# Patient Record
Sex: Male | Born: 1980 | Race: Black or African American | Hispanic: No | Marital: Single | Smoking: Current every day smoker
Health system: Southern US, Community
[De-identification: ages and names within clinical notes are randomized; demographics above are authoritative.]

## PROBLEM LIST (undated history)

## (undated) DIAGNOSIS — E119 Type 2 diabetes mellitus without complications: Secondary | ICD-10-CM

## (undated) DIAGNOSIS — F17200 Nicotine dependence, unspecified, uncomplicated: Secondary | ICD-10-CM

---

## 2014-12-09 ENCOUNTER — Emergency Department (HOSPITAL_BASED_OUTPATIENT_CLINIC_OR_DEPARTMENT_OTHER): Payer: Self-pay

## 2014-12-09 ENCOUNTER — Encounter (HOSPITAL_BASED_OUTPATIENT_CLINIC_OR_DEPARTMENT_OTHER): Payer: Self-pay

## 2014-12-09 ENCOUNTER — Emergency Department (HOSPITAL_BASED_OUTPATIENT_CLINIC_OR_DEPARTMENT_OTHER)
Admission: EM | Admit: 2014-12-09 | Discharge: 2014-12-10 | Disposition: A | Discharge status: Home / Self Care | Payer: Self-pay | Attending: Emergency Medicine | Admitting: Emergency Medicine

## 2014-12-09 DIAGNOSIS — K5900 Constipation, unspecified: Secondary | ICD-10-CM | POA: Insufficient documentation

## 2014-12-09 DIAGNOSIS — Z720 Tobacco use: Secondary | ICD-10-CM | POA: Insufficient documentation

## 2014-12-09 DIAGNOSIS — R110 Nausea: Secondary | ICD-10-CM | POA: Insufficient documentation

## 2014-12-09 LAB — CBC WITH DIFFERENTIAL/PLATELET
Basophils Absolute: 0.1 10*3/uL (ref 0.0–0.1)
Basophils Relative: 1 %
EOS ABS: 0.1 10*3/uL (ref 0.0–0.7)
Eosinophils Relative: 2 %
HEMATOCRIT: 44.5 % (ref 39.0–52.0)
HEMOGLOBIN: 16.2 g/dL (ref 13.0–17.0)
LYMPHS ABS: 3.6 10*3/uL (ref 0.7–4.0)
LYMPHS PCT: 49 %
MCH: 31.6 pg (ref 26.0–34.0)
MCHC: 36.4 g/dL — AB (ref 30.0–36.0)
MCV: 86.9 fL (ref 78.0–100.0)
MONOS PCT: 9 %
Monocytes Absolute: 0.7 10*3/uL (ref 0.1–1.0)
NEUTROS PCT: 39 %
Neutro Abs: 2.8 10*3/uL (ref 1.7–7.7)
Platelets: 321 10*3/uL (ref 150–400)
RBC: 5.12 MIL/uL (ref 4.22–5.81)
RDW: 12 % (ref 11.5–15.5)
WBC: 7.2 10*3/uL (ref 4.0–10.5)

## 2014-12-09 LAB — COMPREHENSIVE METABOLIC PANEL
ALBUMIN: 4.3 g/dL (ref 3.5–5.0)
ALK PHOS: 64 U/L (ref 38–126)
ALT: 37 U/L (ref 17–63)
AST: 25 U/L (ref 15–41)
Anion gap: 5 (ref 5–15)
BUN: 14 mg/dL (ref 6–20)
CHLORIDE: 103 mmol/L (ref 101–111)
CO2: 31 mmol/L (ref 22–32)
CREATININE: 1.1 mg/dL (ref 0.61–1.24)
Calcium: 9.2 mg/dL (ref 8.9–10.3)
GFR calc non Af Amer: >60 mL/min (ref >60)
GLUCOSE: 131 mg/dL — AB (ref 65–99)
Potassium: 3.6 mmol/L (ref 3.5–5.1)
Sodium: 139 mmol/L (ref 135–145)
Total Bilirubin: 0.7 mg/dL (ref 0.3–1.2)
Total Protein: 7.3 g/dL (ref 6.5–8.1)

## 2014-12-09 LAB — LIPASE, BLOOD: Lipase: 33 U/L (ref 11–51)

## 2014-12-09 NOTE — ED Provider Notes (Signed)
CSN: [id number]     Arrival date & time 12/09/14  2156 History  By signing my name below, I, Tanda Rockers, attest that this documentation has been prepared under the direction and in the presence of Shon Baton, MD. Electronically Signed: Tanda Rockers, ED Scribe. 12/09/2014. 11:15 PM.  Chief Complaint  Patient presents with  . Abdominal Pain   The history is provided by the patient. No language interpreter was used.     HPI Comments: Frank Duncan is a 34 y.o. male who presents to the Emergency Department complaining of gradual onset, intermittent, tight, 6/10, lower abdominal pain x 1 week. Pt also complains of constipation, nausea, and vomiting. He states his last bowel movement was 3 days ago which he describes as soft. He has been taking laxatives with some relief from the constipation and abdominal pain, but he states the abdominal pain returns. Denies fever, chills, any other associated symptoms.    History reviewed. No pertinent past medical history. History reviewed. No pertinent past surgical history. No family history on file. Social History  Substance Use Topics  . Smoking status: Current Every Day Smoker  . Smokeless tobacco: None  . Alcohol Use: Yes     Comment: weekends    Review of Systems  Constitutional: Negative for fever and chills.  Respiratory: Negative for shortness of breath.   Cardiovascular: Negative for chest pain.  Gastrointestinal: Positive for nausea, vomiting, abdominal pain and constipation. Negative for diarrhea.  All other systems reviewed and are negative.  Allergies  Review of patient's allergies indicates no known allergies.  Home Medications   Prior to Admission medications   Medication Sig Start Date End Date Taking? Authorizing Provider  docusate sodium (COLACE) 100 MG capsule Take 1 capsule (100 mg total) by mouth every 12 (twelve) hours. 12/10/14   Shon Baton, MD  polyethylene glycol powder (MIRALAX) powder Take one  capful by mouth 2 times a day until stools are loose 12/10/14   Shon Baton, MD   BP 152/99 mmHg  Pulse 74  Temp(Src) 98.5 F (36.9 C) (Oral)  Resp 18  Ht  (1.753 m)  Wt 255 lb (115.667 kg)  BMI 37.64 kg/m2  SpO2 98%   Physical Exam  Constitutional: He is oriented to person, place, and time. He appears well-developed and well-nourished. No distress.  HENT:  Head: Normocephalic and atraumatic.  Cardiovascular: Normal rate, regular rhythm and normal heart sounds.   No murmur heard. Pulmonary/Chest: Effort normal and breath sounds normal. No respiratory distress. He has no wheezes.  Abdominal: Soft. Bowel sounds are normal. He exhibits no distension. There is no tenderness. There is no rebound.  Musculoskeletal: He exhibits no edema.  Neurological: He is alert and oriented to person, place, and time.  Skin: Skin is warm and dry.  Psychiatric: He has a normal mood and affect.  Nursing note and vitals reviewed.   ED Course  Procedures (including critical care time)  DIAGNOSTIC STUDIES: Oxygen Saturation is 98% on RA, normal by my interpretation.    COORDINATION OF CARE: 11:15 PM-Discussed treatment plan which includes DG Abdominal series, CBC, CMP, Lipase with pt at bedside and pt agreed to plan.   Labs Review Labs Reviewed  CBC WITH DIFFERENTIAL/PLATELET - Abnormal; Notable for the following:    MCHC 36.4 (*)    All other components within normal limits  COMPREHENSIVE METABOLIC PANEL - Abnormal; Notable for the following:    Glucose, Bld 131 (*)    All other components  within normal limits  URINALYSIS, ROUTINE W REFLEX MICROSCOPIC (NOT AT [facility]) - Abnormal; Notable for the following:    Ketones, ur 15 (*)    All other components within normal limits  LIPASE, BLOOD    Imaging Review Dg Abd 1 View  12/09/2014  CLINICAL DATA:  Abdominal pain for 1 week EXAM: ABDOMEN - 1 VIEW COMPARISON:  None. FINDINGS: Scattered large and small bowel gas is seen. No obstructive  changes are noted. Mild fecal material is noted within the right colon. No acute bony abnormality is seen. IMPRESSION: No acute abnormality noted. Electronically Signed   By: Alcide CleverMark  Lukens M.D.   On: 12/09/2014 23:30   I have personally reviewed and evaluated these images and lab results as part of my medical decision-making.   EKG Interpretation None      MDM   Final diagnoses:  Constipation, unspecified constipation type   Patient presents with abdominal pain and constipation. Reports one-week history of abdominal bloating and fullness over the lower abdomen. Nontoxic on exam. Vital signs reassuring. Abdominal exam is unremarkable. Basic lab work and plain films of abdomen are reassuring. Patient does have a mild amount of fecal matter in the right colon. Clinically, he reports constipation. Given benign abdominal exam, will discharge with MiraLAX and Colace.  Patient was given return cautions.  After history, exam, and medical workup I feel the patient has been appropriately medically screened and is safe for discharge home. Pertinent diagnoses were discussed with the patient. Patient was given return precautions.  I personally performed the services described in this documentation, which was scribed in my presence. The recorded information has been reviewed and is accurate.      Shon Batonourtney F Zebedee Segundo, MD 12/10/14 [phone]

## 2014-12-09 NOTE — ED Notes (Addendum)
Abd pain, constipation x 1 week-pt states he took a laxative last Tuesday with small results-also took "2 colon cleanse" yesterday-small BM today-denies vomiting

## 2014-12-10 LAB — URINALYSIS, ROUTINE W REFLEX MICROSCOPIC
Bilirubin Urine: NEGATIVE
GLUCOSE, UA: NEGATIVE mg/dL
Hgb urine dipstick: NEGATIVE
Ketones, ur: 15 mg/dL — AB
LEUKOCYTES UA: NEGATIVE
NITRITE: NEGATIVE
PH: 7 (ref 5.0–8.0)
Protein, ur: NEGATIVE mg/dL
SPECIFIC GRAVITY, URINE: 1.028 (ref 1.005–1.030)
Urobilinogen, UA: 1 mg/dL (ref 0.0–1.0)

## 2014-12-10 MED ORDER — DOCUSATE SODIUM 100 MG PO CAPS: 100.0000 mg | ORAL_CAPSULE | Freq: Two times a day (BID) | ORAL | Status: AC

## 2014-12-10 MED ORDER — POLYETHYLENE GLYCOL 3350 17 GM/SCOOP PO POWD: ORAL | Status: AC

## 2014-12-10 NOTE — Discharge Instructions (Signed)
Constipation, Adult Constipation is when a person has fewer than three bowel movements a week, has difficulty having a bowel movement, or has stools that are dry, hard, or larger than normal. As people grow older, constipation is more common. A low-fiber diet, not taking in enough fluids, and taking certain medicines may make constipation worse.  CAUSES   Certain medicines, such as antidepressants, pain medicine, iron supplements, antacids, and water pills.   Certain diseases, such as diabetes, irritable bowel syndrome (IBS), thyroid disease, or depression.   Not drinking enough water.   Not eating enough fiber-rich foods.   Stress or travel.   Lack of physical activity or exercise.   Ignoring the urge to have a bowel movement.   Using laxatives too much.  SIGNS AND SYMPTOMS   Having fewer than three bowel movements a week.   Straining to have a bowel movement.   Having stools that are hard, dry, or larger than normal.   Feeling full or bloated.   Pain in the lower abdomen.   Not feeling relief after having a bowel movement.  DIAGNOSIS  Your health care provider will take a medical history and perform a physical exam. Further testing may be done for severe constipation. Some tests may include:  A barium enema X-ray to examine your rectum, colon, and, sometimes, your small intestine.   A sigmoidoscopy to examine your lower colon.   A colonoscopy to examine your entire colon. TREATMENT  Treatment will depend on the severity of your constipation and what is causing it. Some dietary treatments include drinking more fluids and eating more fiber-rich foods. Lifestyle treatments may include regular exercise. If these diet and lifestyle recommendations do not help, your health care provider may recommend taking over-the-counter laxative medicines to help you have bowel movements. Prescription medicines may be prescribed if over-the-counter medicines do not work.    HOME CARE INSTRUCTIONS   Eat foods that have a lot of fiber, such as fruits, vegetables, whole grains, and beans.  Limit foods high in fat and processed sugars, such as french fries, hamburgers, cookies, candies, and soda.   A fiber supplement may be added to your diet if you cannot get enough fiber from foods.   Drink enough fluids to keep your urine clear or pale yellow.   Exercise regularly or as directed by your health care provider.   Go to the restroom when you have the urge to go. Do not hold it.   Only take over-the-counter or prescription medicines as directed by your health care provider. Do not take other medicines for constipation without talking to your health care provider first.  SEEK IMMEDIATE MEDICAL CARE IF:   You have bright red blood in your stool.   Your constipation lasts for more than 4 days or gets worse.   You have abdominal or rectal pain.   You have thin, pencil-like stools.   You have unexplained weight loss.  Vomiting MAKE SURE YOU:   Understand these instructions.  Will watch your condition.  Will get help right away if you are not doing well or get worse.   This information is not intended to replace advice given to you by your health care provider. Make sure you discuss any questions you have with your health care provider.   Document Released: 10/24/2003 Document Revised: 02/15/2014 Document Reviewed: 11/06/2012 Elsevier Interactive Patient Education [employer]2016 Elsevier Inc.

## 2015-11-13 ENCOUNTER — Encounter (HOSPITAL_BASED_OUTPATIENT_CLINIC_OR_DEPARTMENT_OTHER): Payer: Self-pay | Admitting: *Deleted

## 2015-11-13 ENCOUNTER — Inpatient Hospital Stay (HOSPITAL_BASED_OUTPATIENT_CLINIC_OR_DEPARTMENT_OTHER)
Admission: EM | Admit: 2015-11-13 | Discharge: 2015-11-18 | DRG: 639 | Disposition: A | Discharge status: Home / Self Care | Payer: Self-pay | Attending: Family Medicine | Admitting: Family Medicine

## 2015-11-13 DIAGNOSIS — Z79899 Other long term (current) drug therapy: Secondary | ICD-10-CM

## 2015-11-13 DIAGNOSIS — F17210 Nicotine dependence, cigarettes, uncomplicated: Secondary | ICD-10-CM | POA: Diagnosis present

## 2015-11-13 DIAGNOSIS — E1100 Type 2 diabetes mellitus with hyperosmolarity without nonketotic hyperglycemic-hyperosmolar coma (NKHHC): Principal | ICD-10-CM | POA: Diagnosis present

## 2015-11-13 DIAGNOSIS — F1910 Other psychoactive substance abuse, uncomplicated: Secondary | ICD-10-CM | POA: Diagnosis present

## 2015-11-13 DIAGNOSIS — R059 Cough, unspecified: Secondary | ICD-10-CM

## 2015-11-13 DIAGNOSIS — E1101 Type 2 diabetes mellitus with hyperosmolarity with coma: Secondary | ICD-10-CM

## 2015-11-13 DIAGNOSIS — F17200 Nicotine dependence, unspecified, uncomplicated: Secondary | ICD-10-CM | POA: Diagnosis present

## 2015-11-13 DIAGNOSIS — R05 Cough: Secondary | ICD-10-CM | POA: Diagnosis present

## 2015-11-13 HISTORY — DX: Nicotine dependence, unspecified, uncomplicated: F17.200

## 2015-11-13 HISTORY — DX: Type 2 diabetes mellitus without complications: E11.9

## 2015-11-13 LAB — BASIC METABOLIC PANEL
ANION GAP: 10 (ref 5–15)
BUN: 10 mg/dL (ref 6–20)
CALCIUM: 9.1 mg/dL (ref 8.9–10.3)
CO2: 27 mmol/L (ref 22–32)
CREATININE: 0.8 mg/dL (ref 0.61–1.24)
Chloride: 98 mmol/L — ABNORMAL LOW (ref 101–111)
GFR calc Af Amer: >60 mL/min (ref >60)
GLUCOSE: 330 mg/dL — AB (ref 65–99)
Potassium: 3.5 mmol/L (ref 3.5–5.1)
Sodium: 135 mmol/L (ref 135–145)

## 2015-11-13 LAB — CBC WITH DIFFERENTIAL/PLATELET
BASOS ABS: 0 10*3/uL (ref 0.0–0.1)
BASOS PCT: 1 %
EOS ABS: 0 10*3/uL (ref 0.0–0.7)
EOS PCT: 1 %
HCT: 45.8 % (ref 39.0–52.0)
Hemoglobin: 16.8 g/dL (ref 13.0–17.0)
Lymphocytes Relative: 31 %
Lymphs Abs: 1.3 10*3/uL (ref 0.7–4.0)
MCH: 30.8 pg (ref 26.0–34.0)
MCHC: 36.7 g/dL — ABNORMAL HIGH (ref 30.0–36.0)
MCV: 84 fL (ref 78.0–100.0)
Monocytes Absolute: 0.4 10*3/uL (ref 0.1–1.0)
Monocytes Relative: 11 %
Neutro Abs: 2.3 10*3/uL (ref 1.7–7.7)
Neutrophils Relative %: 56 %
PLATELETS: 226 10*3/uL (ref 150–400)
RBC: 5.45 MIL/uL (ref 4.22–5.81)
RDW: 12.6 % (ref 11.5–15.5)
WBC: 4.1 10*3/uL (ref 4.0–10.5)

## 2015-11-13 LAB — I-STAT VENOUS BLOOD GAS, ED
ACID-BASE EXCESS: 2 mmol/L (ref 0.0–2.0)
BICARBONATE: 30 mmol/L — AB (ref 20.0–28.0)
O2 Saturation: 33 %
TCO2: 32 mmol/L (ref 0–100)
pCO2, Ven: 57.6 mmHg (ref 44.0–60.0)
pH, Ven: 7.322 (ref 7.250–7.430)
pO2, Ven: 22 mmHg — CL (ref 32.0–45.0)

## 2015-11-13 LAB — URINALYSIS, ROUTINE W REFLEX MICROSCOPIC
Bilirubin Urine: NEGATIVE
Glucose, UA: >1000 mg/dL — AB
HGB URINE DIPSTICK: NEGATIVE
Ketones, ur: 15 mg/dL — AB
Leukocytes, UA: NEGATIVE
NITRITE: NEGATIVE
Protein, ur: NEGATIVE mg/dL
SPECIFIC GRAVITY, URINE: 1.031 — AB (ref 1.005–1.030)
pH: 5 (ref 5.0–8.0)

## 2015-11-13 LAB — CBG MONITORING, ED: Glucose-Capillary: 585 mg/dL (ref 65–99)

## 2015-11-13 LAB — GLUCOSE, CAPILLARY
GLUCOSE-CAPILLARY: 312 mg/dL — AB (ref 65–99)
Glucose-Capillary: 366 mg/dL — ABNORMAL HIGH (ref 65–99)
Glucose-Capillary: 277 mg/dL — ABNORMAL HIGH (ref 65–99)

## 2015-11-13 LAB — COMPREHENSIVE METABOLIC PANEL
ALT: 20 U/L (ref 17–63)
AST: 15 U/L (ref 15–41)
Albumin: 4 g/dL (ref 3.5–5.0)
Alkaline Phosphatase: 94 U/L (ref 38–126)
Anion gap: 15 (ref 5–15)
BUN: 14 mg/dL (ref 6–20)
CHLORIDE: 83 mmol/L — AB (ref 101–111)
CO2: 24 mmol/L (ref 22–32)
CREATININE: 1.09 mg/dL (ref 0.61–1.24)
Calcium: 9.3 mg/dL (ref 8.9–10.3)
GFR calc non Af Amer: >60 mL/min (ref >60)
Glucose, Bld: 999 mg/dL (ref 65–99)
Potassium: 4.4 mmol/L (ref 3.5–5.1)
SODIUM: 122 mmol/L — AB (ref 135–145)
Total Bilirubin: 1.3 mg/dL — ABNORMAL HIGH (ref 0.3–1.2)
Total Protein: 7 g/dL (ref 6.5–8.1)

## 2015-11-13 LAB — URINE MICROSCOPIC-ADD ON
Bacteria, UA: NONE SEEN
RBC / HPF: NONE SEEN RBC/hpf (ref 0–5)
Squamous Epithelial / LPF: NONE SEEN

## 2015-11-13 LAB — LIPASE, BLOOD: Lipase: 38 U/L (ref 11–51)

## 2015-11-13 LAB — MRSA PCR SCREENING: MRSA BY PCR: NEGATIVE

## 2015-11-13 MED ORDER — ACETAMINOPHEN 500 MG PO TABS: 1000.0000 mg | ORAL_TABLET | Freq: Once | ORAL | Status: DC

## 2015-11-13 MED ORDER — IBUPROFEN 800 MG PO TABS: 800.0000 mg | ORAL_TABLET | Freq: Once | ORAL | Status: DC

## 2015-11-13 MED ORDER — SODIUM CHLORIDE 0.9 % IV BOLUS (SEPSIS)
2000.0000 mL | Freq: Once | INTRAVENOUS | Status: AC
  Administered 2015-11-13: 2000 mL via INTRAVENOUS

## 2015-11-13 MED ORDER — INSULIN GLARGINE 100 UNIT/ML ~~LOC~~ SOLN
10.0000 [IU] | Freq: Every day | SUBCUTANEOUS | Status: DC
  Administered 2015-11-14: 10 [IU] via SUBCUTANEOUS
  Filled 2015-11-13 (×2): qty 0.1

## 2015-11-13 MED ORDER — DIAZEPAM 5 MG PO TABS: 5.0000 mg | ORAL_TABLET | Freq: Once | ORAL | Status: DC

## 2015-11-13 MED ORDER — ONDANSETRON HCL 4 MG PO TABS: 4.0000 mg | ORAL_TABLET | Freq: Four times a day (QID) | ORAL | Status: DC | PRN

## 2015-11-13 MED ORDER — INSULIN REGULAR BOLUS VIA INFUSION
0.0000 [IU] | Freq: Three times a day (TID) | INTRAVENOUS | Status: DC
  Filled 2015-11-13: qty 10

## 2015-11-13 MED ORDER — ACETAMINOPHEN 325 MG PO TABS
650.0000 mg | ORAL_TABLET | Freq: Four times a day (QID) | ORAL | Status: DC | PRN
  Administered 2015-11-14 – 2015-11-16 (×2): 650 mg via ORAL
  Filled 2015-11-13 (×2): qty 2

## 2015-11-13 MED ORDER — INSULIN REGULAR HUMAN 100 UNIT/ML IJ SOLN
INTRAMUSCULAR | Status: DC
  Filled 2015-11-13: qty 2.5

## 2015-11-13 MED ORDER — DEXTROSE-NACL 5-0.45 % IV SOLN: INTRAVENOUS | Status: DC

## 2015-11-13 MED ORDER — SODIUM CHLORIDE 0.45 % IV SOLN
INTRAVENOUS | Status: DC
  Administered 2015-11-13: 19:00:00 via INTRAVENOUS

## 2015-11-13 MED ORDER — DEXTROSE 50 % IV SOLN: 25.0000 mL | INTRAVENOUS | Status: DC | PRN

## 2015-11-13 MED ORDER — INFLUENZA VAC SPLIT QUAD 0.5 ML IM SUSY
0.5000 mL | PREFILLED_SYRINGE | INTRAMUSCULAR | Status: DC
  Filled 2015-11-13 (×2): qty 0.5

## 2015-11-13 MED ORDER — SODIUM CHLORIDE 0.45 % IV SOLN
INTRAVENOUS | Status: DC
  Administered 2015-11-14: 03:00:00 via INTRAVENOUS

## 2015-11-13 MED ORDER — GI COCKTAIL ~~LOC~~
30.0000 mL | Freq: Once | ORAL | Status: AC
  Administered 2015-11-13: 30 mL via ORAL
  Filled 2015-11-13: qty 30

## 2015-11-13 MED ORDER — ONDANSETRON HCL 4 MG/2ML IJ SOLN: 4.0000 mg | Freq: Four times a day (QID) | INTRAMUSCULAR | Status: DC | PRN

## 2015-11-13 MED ORDER — OXYCODONE HCL 5 MG PO TABS: 5.0000 mg | ORAL_TABLET | Freq: Once | ORAL | Status: DC

## 2015-11-13 MED ORDER — ACETAMINOPHEN 650 MG RE SUPP: 650.0000 mg | Freq: Four times a day (QID) | RECTAL | Status: DC | PRN

## 2015-11-13 MED ORDER — INSULIN ASPART 100 UNIT/ML ~~LOC~~ SOLN
0.0000 [IU] | Freq: Three times a day (TID) | SUBCUTANEOUS | Status: DC
Start: 2015-11-14 — End: 2015-11-14
  Administered 2015-11-14: 5 [IU] via SUBCUTANEOUS
  Administered 2015-11-14: 11 [IU] via SUBCUTANEOUS

## 2015-11-13 MED ORDER — ENOXAPARIN SODIUM 60 MG/0.6ML ~~LOC~~ SOLN
55.0000 mg | Freq: Every day | SUBCUTANEOUS | Status: DC
  Administered 2015-11-14 (×2): 55 mg via SUBCUTANEOUS
  Filled 2015-11-13 (×2): qty 0.6

## 2015-11-13 MED ORDER — SODIUM CHLORIDE 0.9 % IV SOLN
INTRAVENOUS | Status: DC
  Filled 2015-11-13: qty 2.5

## 2015-11-13 MED ORDER — INSULIN REGULAR HUMAN 100 UNIT/ML IJ SOLN
INTRAMUSCULAR | Status: DC
  Administered 2015-11-13: 19:00:00 via INTRAVENOUS
  Filled 2015-11-13 (×2): qty 2.5

## 2015-11-13 MED ORDER — PNEUMOCOCCAL VAC POLYVALENT 25 MCG/0.5ML IJ INJ: 0.5000 mL | INJECTION | INTRAMUSCULAR | Status: DC

## 2015-11-13 MED ORDER — ENOXAPARIN SODIUM 40 MG/0.4ML ~~LOC~~ SOLN: 40.0000 mg | SUBCUTANEOUS | Status: DC

## 2015-11-13 NOTE — ED Triage Notes (Signed)
Upper abdominal pain and bloating. Epigastric burning. He admits to constipation.

## 2015-11-13 NOTE — ED Notes (Signed)
MD at bedside. 

## 2015-11-13 NOTE — H&P (Signed)
History and Physical    Frank Duncan ZOX:[id number] DOB: [birth year] DOA: 11/13/2015  PCP: No PCP Per Patient Consultants:  None Patient coming from: home - lives with girlfriend; NOK: girlfriend, Virgil Benedict, [phone]  Chief Complaint: abdominal pain  HPI: Frank Duncan is a 35 y.o. male with no known past medical history who reports that he has had epigastric abdominal pain all week with emesis.  Very weak.  Mouth has been dry.  Girlfriend talked him into coming in to hospital, glucose 1000.  He got a GI cocktail with some improvement. Pain was a burning all the way from throat to epigastric region, felt like acid reflux.  Pain would come and go, worse with belching.  Polyuria, polydipsia for about a month.  Felt like things were too thick, tough to swallow but he could do it.  Tingling and burning with urination x few nights.  Legs cramping up a lot.   ED Course:  Per Dr. Adela Lank: 35 yo M With epigastric abdominal pain. Suspect reflux based on history. Will obtain laboratory evaluation. Reassess.  Marked hyperglycemia, not acidotic, likely in HHS.  Given iv fluids, started on insulin gtt.  Admit.    Review of Systems: As per HPI; otherwise 10 point review of systems reviewed and negative.   Ambulatory Status:  Ambulates without assistance  History reviewed. No pertinent past medical history.  History reviewed. No pertinent surgical history.  Social History   Social History  . Marital status: Single    Spouse name: N/A  . Number of children: N/A  . Years of education: N/A   Occupational History  . warehouse    Social History Main Topics  . Smoking status: Current Every Day Smoker    Packs/day: 1.00    Years: 20.00  . Smokeless tobacco: Never Used  . Alcohol use Yes     Comment: drinks only socailly but heavily when he does drink  . Drug use:     Types: Marijuana, Cocaine     Comment: almost daily  . Sexual activity: Not on file   Other Topics Concern  . Not on  file   Social History Narrative  . No narrative on file    No Known Allergies  Family History  Problem Relation Age of Onset  . Hypertension Mother   . Diabetes Maternal Grandmother     Prior to Admission medications   Medication Sig Start Date End Date Taking? Authorizing Provider  docusate sodium (COLACE) 100 MG capsule Take 1 capsule (100 mg total) by mouth every 12 (twelve) hours. Patient not taking: Reported on 11/13/2015 12/10/14   Shon Baton, MD  polyethylene glycol powder [facility]) powder Take one capful by mouth 2 times a day until stools are loose Patient not taking: Reported on 11/13/2015 12/10/14   Shon Baton, MD    Physical Exam: Vitals:   11/13/15 1820 11/13/15 1942 11/13/15 2054 11/13/15 2100  BP: 118/85 122/82  139/76  Pulse: 84 67  65  Resp: 18 19  15   Temp:  97.8 F (36.6 C) 98.7 F (37.1 C)   TempSrc:  Oral Oral   SpO2: 97% 99%  98%  Weight:   110 kg (242 lb 8.1 oz)   Height:   5\' 9"  (1.753 m)      General:  Appears calm and comfortable and is NAD Eyes: EOMI, normal lids, iris ENT:  grossly normal hearing, lips & tongue, mmm Neck:  no LAD, masses or thyromegaly Cardiovascular:  RRR, no  m/r/g. No LE edema.  Respiratory:  CTA bilaterally, no w/r/r. Normal respiratory effort. Abdomen:  soft, ntnd, NABS Skin:  no rash or induration seen on limited exam Musculoskeletal:  grossly normal tone BUE/BLE, good ROM, no bony abnormality Psychiatric:  grossly normal mood and affect, speech fluent and appropriate, AOx3 Neurologic:  CN 2-12 grossly intact, moves all extremities in coordinated fashion, sensation intact  Labs on Admission: I have personally reviewed following labs and imaging studies  CBC:  Recent Labs Lab 11/13/15 1715  WBC 4.1  NEUTROABS 2.3  HGB 16.8  HCT 45.8  MCV 84.0  PLT 226   Basic Metabolic Panel:  Recent Labs Lab 11/13/15 1715 11/13/15 2211  NA 122* 135  K 4.4 3.5  CL 83* 98*  CO2 24 27  GLUCOSE 999* 330*    BUN 14 10  CREATININE 1.09 0.80  CALCIUM 9.3 9.1   GFR: Estimated Creatinine Clearance: 156 mL/min (by C-G formula based on SCr of 0.8 mg/dL). Liver Function Tests:  Recent Labs Lab 11/13/15 1715  AST 15  ALT 20  ALKPHOS 94  BILITOT 1.3*  PROT 7.0  ALBUMIN 4.0    Recent Labs Lab 11/13/15 1715  LIPASE 38   No results for input(s): AMMONIA in the last 168 hours. Coagulation Profile: No results for input(s): INR, PROTIME in the last 168 hours. Cardiac Enzymes: No results for input(s): CKTOTAL, CKMB, CKMBINDEX, TROPONINI in the last 168 hours. BNP (last 3 results) No results for input(s): PROBNP in the last 8760 hours. HbA1C: No results for input(s): HGBA1C in the last 72 hours. CBG:  Recent Labs Lab 11/13/15 1812 11/13/15 1934 11/13/15 2155 11/13/15 2255  GLUCAP >600* 585* 366* 277*   Lipid Profile: No results for input(s): CHOL, HDL, LDLCALC, TRIG, CHOLHDL, LDLDIRECT in the last 72 hours. Thyroid Function Tests: No results for input(s): TSH, T4TOTAL, FREET4, T3FREE, THYROIDAB in the last 72 hours. Anemia Panel: No results for input(s): VITAMINB12, FOLATE, FERRITIN, TIBC, IRON, RETICCTPCT in the last 72 hours. Urine analysis:    Component Value Date/Time   COLORURINE YELLOW 11/13/2015 1714   APPEARANCEUR CLEAR 11/13/2015 1714   LABSPEC 1.031 (H) 11/13/2015 1714   PHURINE 5.0 11/13/2015 1714   GLUCOSEU >1000 (A) 11/13/2015 1714   HGBUR NEGATIVE 11/13/2015 1714   BILIRUBINUR NEGATIVE 11/13/2015 1714   KETONESUR 15 (A) 11/13/2015 1714   PROTEINUR NEGATIVE 11/13/2015 1714   UROBILINOGEN 1.0 12/10/2014 0001   NITRITE NEGATIVE 11/13/2015 1714   LEUKOCYTESUR NEGATIVE 11/13/2015 1714    Creatinine Clearance: Estimated Creatinine Clearance: 156 mL/min (by C-G formula based on SCr of 0.8 mg/dL).  Sepsis Labs: @LABRCNTIP (procalcitonin:4,lacticidven:4) ) Recent Results (from the past 240 hour(s))  MRSA PCR Screening     Status: None   Collection Time:  11/13/15  8:57 PM  Result Value Ref Range Status   MRSA by PCR NEGATIVE NEGATIVE Final    Comment:        The GeneXpert MRSA Assay (FDA approved for NASAL specimens only), is one component of a comprehensive MRSA colonization surveillance program. It is not intended to diagnose MRSA infection nor to guide or monitor treatment for MRSA infections.      Radiological Exams on Admission: No results found.  EKG: not done  Assessment/Plan Principal Problem:   Type 2 diabetes mellitus with hyperosmolar nonketotic hyperglycemia (HCC) Active Problems:   Tobacco use disorder   Polysubstance abuse   New-onset DM with HHS -Patient with prior h/o mild hyperglycemia 1 year ago but does not see  a physician regularly -Suspect that patient's reported alcoholic bender over the weekend was enough to tip him into HHS -Clearly hyperglycemic on presentation with glucose >1000 -No acidosis on presentation -Has been on the Glucostabilizer protocol with insulin drip with improvement, gap is now closed and glucose is slowly normalizing -Will transition now to Lantus 10 units, let the patient eat, and turn off insulin drip in 1 hour -Will cover with qAC and qhs SSI -Hgb A1c is pending -Diabetes education requested -Patient is likely to benefit from at least an additional day in the hospital to ensure no rebound of hyperglycemia and ensure adequate DM education -SW consult to assist patient with obtaining a PCP  Tobacco dependence -Patient smokes 1 PPD -Counseling provided regarding the hazards of smoking and DM -Encourage cessation.  This was discussed with the patient and should be reviewed on an ongoing basis.   -Patch ordered at patient request.  Polysubstance abuse -patient acknowledges binge drinking (including this past weekend, with persistent abdominal pain since) -He is less forthcoming about his drug use - he acknowledges frequent if not daily marijuana use and grudgingly admitted  cocaine and other drug abuse (he said something to the effect that he had not used recently enough that he would expect it to show up in his UDS, which is pending) -He has been counseled regarding cessation and this should be encouraged on an ongoing basis   DVT prophylaxis:  Lovenox  Code Status: Full - confirmed with patient/family Family Communication: Girlfriend present for the conclusion of our visit Disposition Plan:  Home once clinically improved Consults called: Diabetes coordinator, SW   Admission status: Admit - It is my clinical opinion that admission to INPATIENT is reasonable and necessary because this patient will require at least 2 midnights in the hospital to treat this condition based on the medical complexity of the problems presented.  Given the aforementioned information, the predictability of an adverse outcome is felt to be significant.     Jonah Blue MD [facility]  If 7PM-7AM, please contact night-coverage [email or web address] Password [email or web address]  11/13/2015, 11:49 PM

## 2015-11-13 NOTE — ED Provider Notes (Signed)
[facility] Provider Note   CSN: [id number] Arrival date & time: 11/13/15  1709     History   Chief Complaint Chief Complaint  Patient presents with  . Abdominal Pain    HPI Frank Duncan is a 35 y.o. male.  35 yo M with a chief complaint of epigastric abdominal pain. Going on for the past week or so. Unsure what makes it better or worse. Nonexertional. Denies shortness breath or diaphoresis with it. Has some bouts of nausea but denies vomiting. Denies any diarrhea denies fevers or chills. Denies injury. Denies any specific foods that make this worse.   The history is provided by the patient.  Abdominal Pain   This is a new problem. The current episode started less than 1 hour ago. The problem occurs constantly. The problem has not changed since onset.The pain is located in the epigastric region. The pain is at a severity of 5/10. The pain is moderate. Associated symptoms include nausea. Pertinent negatives include fever, diarrhea, vomiting, headaches, arthralgias and myalgias. Nothing aggravates the symptoms. Nothing relieves the symptoms.    History reviewed. No pertinent past medical history.  Patient Active Problem List   Diagnosis Date Noted  . Type 2 diabetes mellitus with hyperosmolar nonketotic hyperglycemia (HCC) 11/13/2015    History reviewed. No pertinent surgical history.     Home Medications    Prior to Admission medications   Medication Sig Start Date End Date Taking? Authorizing Provider  docusate sodium (COLACE) 100 MG capsule Take 1 capsule (100 mg total) by mouth every 12 (twelve) hours. 12/10/14   Shon Baton, MD  polyethylene glycol powder (MIRALAX) powder Take one capful by mouth 2 times a day until stools are loose 12/10/14   Shon Baton, MD    Family History No family history on file.  Social History Social History  Substance Use Topics  . Smoking status: Current Every Day Smoker    Packs/day: 1.00  . Smokeless  tobacco: Never Used  . Alcohol use Yes     Comment: weekends     Allergies   Review of patient's allergies indicates no known allergies.   Review of Systems Review of Systems  Constitutional: Negative for chills and fever.  HENT: Negative for congestion and facial swelling.   Eyes: Negative for discharge and visual disturbance.  Respiratory: Negative for shortness of breath.   Cardiovascular: Negative for chest pain and palpitations.  Gastrointestinal: Positive for abdominal pain and nausea. Negative for diarrhea and vomiting.  Musculoskeletal: Negative for arthralgias and myalgias.  Skin: Negative for color change and rash.  Neurological: Negative for tremors, syncope and headaches.  Psychiatric/Behavioral: Negative for confusion and dysphoric mood.     Physical Exam Updated Vital Signs BP 118/85   Pulse 84   Temp 97.9 F (36.6 C) (Oral)   Resp 18   Ht 5\' 9"  (1.753 m)   Wt 240 lb (108.9 kg)   SpO2 97%   BMI 35.44 kg/m   Physical Exam  Constitutional: He is oriented to person, place, and time. He appears well-developed and well-nourished.  HENT:  Head: Normocephalic and atraumatic.  Eyes: EOM are normal. Pupils are equal, round, and reactive to light.  Neck: Normal range of motion. Neck supple. No JVD present.  Cardiovascular: Normal rate and regular rhythm.  Exam reveals no gallop and no friction rub.   No murmur heard. Pulmonary/Chest: No respiratory distress. He has no wheezes.  Abdominal: He exhibits no distension and no mass. There is  tenderness. There is no rebound and no guarding.  Musculoskeletal: Normal range of motion.  Neurological: He is alert and oriented to person, place, and time.  Skin: No rash noted. No pallor.  Psychiatric: He has a normal mood and affect. His behavior is normal.  Nursing note and vitals reviewed.    ED Treatments / Results  Labs (all labs ordered are listed, but only abnormal results are displayed) Labs Reviewed  CBC WITH  DIFFERENTIAL/PLATELET - Abnormal; Notable for the following:       Result Value   MCHC 36.7 (*)    All other components within normal limits  COMPREHENSIVE METABOLIC PANEL - Abnormal; Notable for the following:    Sodium 122 (*)    Chloride 83 (*)    Glucose, Bld 999 (*)    Total Bilirubin 1.3 (*)    All other components within normal limits  URINALYSIS, ROUTINE W REFLEX MICROSCOPIC (NOT AT [facility] - Abnormal; Notable for the following:    Specific Gravity, Urine 1.031 (*)    Glucose, UA >1000 (*)    Ketones, ur 15 (*)    All other components within normal limits  CBG MONITORING, ED - Abnormal; Notable for the following:    Glucose-Capillary >600 (*)    All other components within normal limits  I-STAT VENOUS BLOOD GAS, ED - Abnormal; Notable for the following:    pO2, Ven 22.0 (*)    Bicarbonate 30.0 (*)    All other components within normal limits  LIPASE, BLOOD  URINE MICROSCOPIC-ADD ON  BLOOD GAS, VENOUS    EKG  EKG Interpretation None       Radiology No results found.  Procedures Procedures (including critical care time)  Medications Ordered in ED Medications  dextrose 5 %-0.45 % sodium chloride infusion (not administered)  insulin regular bolus via infusion 0-10 Units (not administered)  insulin regular (NOVOLIN R,HUMULIN R) 250 Units in sodium chloride 0.9 % 250 mL (1 Units/mL) infusion ( Intravenous New Bag/Given 11/13/15 1833)  dextrose 50 % solution 25 mL (not administered)  0.45 % sodium chloride infusion (not administered)  gi cocktail (Maalox,Lidocaine,Donnatal) (30 mLs Oral Given 11/13/15 1735)  sodium chloride 0.9 % bolus 2,000 mL (2,000 mLs Intravenous New Bag/Given 11/13/15 1815)     Initial Impression / Assessment and Plan / ED Course  I have reviewed the triage vital signs and the nursing notes.  Pertinent labs & imaging results that were available during my care of the patient were reviewed by me and considered in my medical decision making (see  chart for details).  Clinical Course    35 yo M With epigastric abdominal pain. Suspect reflux based on history. Will obtain laboratory evaluation. Reassess.  Marked hyperglycemia, not acidotic, likely in HHS.  Given iv fluids, started on insulin gtt.  Admit.   CRITICAL CARE Performed by: Rae Roam   Total critical care time: 35 minutes  Critical care time was exclusive of separately billable procedures and treating other patients.  Critical care was necessary to treat or prevent imminent or life-threatening deterioration.  Critical care was time spent personally by me on the following activities: development of treatment plan with patient and/or surrogate as well as nursing, discussions with consultants, evaluation of patient's response to treatment, examination of patient, obtaining history from patient or surrogate, ordering and performing treatments and interventions, ordering and review of laboratory studies, ordering and review of radiographic studies, pulse oximetry and re-evaluation of patient's condition.   The patients results and  plan were reviewed and discussed.   Any x-rays performed were independently reviewed by myself.   Differential diagnosis were considered with the presenting HPI.  Medications  dextrose 5 %-0.45 % sodium chloride infusion (not administered)  insulin regular bolus via infusion 0-10 Units (not administered)  insulin regular (NOVOLIN R,HUMULIN R) 250 Units in sodium chloride 0.9 % 250 mL (1 Units/mL) infusion ( Intravenous New Bag/Given 11/13/15 1833)  dextrose 50 % solution 25 mL (not administered)  0.45 % sodium chloride infusion (not administered)  gi cocktail (Maalox,Lidocaine,Donnatal) (30 mLs Oral Given 11/13/15 1735)  sodium chloride 0.9 % bolus 2,000 mL (2,000 mLs Intravenous New Bag/Given 11/13/15 1815)    Vitals:   11/13/15 1716 11/13/15 1717 11/13/15 1820  BP: 139/90  118/85  Pulse: 86  84  Resp: 18  18  Temp: 97.9 F (36.6  C)    TempSrc: Oral    SpO2: 98%  97%  Weight:  240 lb (108.9 kg)   Height:  5\' 9"  (1.753 m)     Final diagnoses:  Type 2 diabetes mellitus with hyperosmolarity without nonketotic hyperglycemic-hyperosmolar coma [facility]) [facility](HCC)    Admission/ observation were discussed with the admitting physician, patient and/or family and they are comfortable with the plan.    Final Clinical Impressions(s) / ED Diagnoses   Final diagnoses:  Type 2 diabetes mellitus with hyperosmolarity without nonketotic hyperglycemic-hyperosmolar coma [facility]) [facility](HCC)    New Prescriptions New Prescriptions   No medications on file     Melene PlanDan Ferdie Bakken, DO 11/13/15 1904

## 2015-11-13 NOTE — ED Notes (Signed)
Insulin drip to run at 3 units/hr per Dr. Adela LankFloyd.

## 2015-11-13 NOTE — Progress Notes (Signed)
[facility].  He is a 35yo male with new-onset DM, likely HHS.  A few weeks of polydipsia, polyuria.  Presented with GI upset.  No DKA, CO2 ok, VBG 7.32.  IVF, insulin drip.  Will accept for admission to SDU as the patient is likely to require at least 2 midnights in the hospital to treat this condition based on the medical complexity of the problems presented.  Given the aforementioned information, the predictability of an adverse outcome is felt to be significant.  Georgana CurioJennifer E. Eboni Coval, M.D.

## 2015-11-13 NOTE — ED Notes (Signed)
md and primary rn notified of glucose.

## 2015-11-13 NOTE — ED Notes (Signed)
Glucose of 585 entered on glucose stabilizer which calculates drip to go to 10.5 units/hr. Consulted with MD and will not increase insulin drip to that rate. Will maintain insulin drip at current rate of 5.4 units/hr.

## 2015-11-13 NOTE — Progress Notes (Signed)
Rx Brief Lovenox note  Pt wt=110 kg, BMI=35, CrCl~156 ml/min  Rx adjusted Lovenox to 55 mg daily in pt with BMI>30  Thanks Lorenza EvangelistGreen, Lashona Schaaf R 11/13/2015 11:18 PM

## 2015-11-13 NOTE — ED Notes (Signed)
[facility] here for transport. 

## 2015-11-14 LAB — GLUCOSE, CAPILLARY
GLUCOSE-CAPILLARY: 224 mg/dL — AB (ref 65–99)
GLUCOSE-CAPILLARY: 322 mg/dL — AB (ref 65–99)
GLUCOSE-CAPILLARY: 327 mg/dL — AB (ref 65–99)
GLUCOSE-CAPILLARY: 361 mg/dL — AB (ref 65–99)
GLUCOSE-CAPILLARY: 439 mg/dL — AB (ref 65–99)
Glucose-Capillary: 441 mg/dL — ABNORMAL HIGH (ref 65–99)
Glucose-Capillary: 125 mg/dL — ABNORMAL HIGH (ref 65–99)
Glucose-Capillary: 203 mg/dL — ABNORMAL HIGH (ref 65–99)
Glucose-Capillary: 324 mg/dL — ABNORMAL HIGH (ref 65–99)

## 2015-11-14 LAB — CBC
HCT: 42.7 % (ref 39.0–52.0)
HEMOGLOBIN: 15.9 g/dL (ref 13.0–17.0)
MCH: 31.2 pg (ref 26.0–34.0)
MCHC: 37.2 g/dL — ABNORMAL HIGH (ref 30.0–36.0)
MCV: 83.9 fL (ref 78.0–100.0)
PLATELETS: 244 10*3/uL (ref 150–400)
RBC: 5.09 MIL/uL (ref 4.22–5.81)
RDW: 13 % (ref 11.5–15.5)
WBC: 5.2 10*3/uL (ref 4.0–10.5)

## 2015-11-14 LAB — RAPID URINE DRUG SCREEN, HOSP PERFORMED
Amphetamines: NOT DETECTED
BARBITURATES: NOT DETECTED
Benzodiazepines: NOT DETECTED
Cocaine: NOT DETECTED
OPIATES: NOT DETECTED
TETRAHYDROCANNABINOL: POSITIVE — AB

## 2015-11-14 LAB — BASIC METABOLIC PANEL
ANION GAP: 10 (ref 5–15)
BUN: 12 mg/dL (ref 6–20)
CALCIUM: 9.2 mg/dL (ref 8.9–10.3)
CO2: 26 mmol/L (ref 22–32)
CREATININE: 0.72 mg/dL (ref 0.61–1.24)
Chloride: 98 mmol/L — ABNORMAL LOW (ref 101–111)
Glucose, Bld: 151 mg/dL — ABNORMAL HIGH (ref 65–99)
Potassium: 3.3 mmol/L — ABNORMAL LOW (ref 3.5–5.1)
SODIUM: 134 mmol/L — AB (ref 135–145)

## 2015-11-14 MED ORDER — LISINOPRIL 5 MG PO TABS
5.0000 mg | ORAL_TABLET | Freq: Every day | ORAL | Status: DC
  Administered 2015-11-14 – 2015-11-15 (×2): 5 mg via ORAL
  Filled 2015-11-14: qty 1
  Filled 2015-11-14: qty 2

## 2015-11-14 MED ORDER — INSULIN ASPART 100 UNIT/ML ~~LOC~~ SOLN
15.0000 [IU] | Freq: Once | SUBCUTANEOUS | Status: AC
  Administered 2015-11-14: 15 [IU] via SUBCUTANEOUS

## 2015-11-14 MED ORDER — LIVING WELL WITH DIABETES BOOK
Freq: Once | Status: AC
  Administered 2015-11-14: 1
  Filled 2015-11-14: qty 1

## 2015-11-14 MED ORDER — POTASSIUM CHLORIDE CRYS ER 20 MEQ PO TBCR
20.0000 meq | EXTENDED_RELEASE_TABLET | Freq: Once | ORAL | Status: AC
  Administered 2015-11-14: 20 meq via ORAL
  Filled 2015-11-14: qty 1

## 2015-11-14 MED ORDER — INSULIN ASPART 100 UNIT/ML ~~LOC~~ SOLN
0.0000 [IU] | Freq: Three times a day (TID) | SUBCUTANEOUS | Status: DC
  Administered 2015-11-15: 15 [IU] via SUBCUTANEOUS

## 2015-11-14 MED ORDER — NICOTINE 14 MG/24HR TD PT24
14.0000 mg | MEDICATED_PATCH | Freq: Every day | TRANSDERMAL | Status: DC
  Administered 2015-11-14 – 2015-11-18 (×5): 14 mg via TRANSDERMAL
  Filled 2015-11-14 (×5): qty 1

## 2015-11-14 MED ORDER — INSULIN ASPART 100 UNIT/ML ~~LOC~~ SOLN
4.0000 [IU] | Freq: Once | SUBCUTANEOUS | Status: AC
  Administered 2015-11-14: 4 [IU] via SUBCUTANEOUS

## 2015-11-14 MED ORDER — INSULIN GLARGINE 100 UNIT/ML ~~LOC~~ SOLN
20.0000 [IU] | Freq: Every day | SUBCUTANEOUS | Status: DC
  Administered 2015-11-14: 20 [IU] via SUBCUTANEOUS
  Filled 2015-11-14 (×2): qty 0.2

## 2015-11-14 NOTE — Progress Notes (Signed)
CSW consulted for medication assistance. Unfortunately, CSW is unable to assist with this request. Please consult RNCM for assistance.  Cori RazorJamie Salaam [place] LCSW [phone]

## 2015-11-14 NOTE — Care Management Note (Signed)
Case Management Note  Patient Details  Name: Frank Duncan MRN: [id number] Date of Birth: [birth year]  Subjective/Objective:        dka with iv insulin drip            Action/Plan: home   Expected Discharge Date:                  Expected Discharge Plan:  Home/Self Care  In-House Referral:     Discharge planning Services     Post Acute Care Choice:    Choice offered to:     DME Arranged:    DME Agency:     HH Arranged:    HH Agency:     Status of Service:  In process, will continue to follow  If discussed at Long Length of Stay Meetings, dates discussed:    Additional Comments:Date:  November 14, 2015 Chart reviewed for concurrent status and case management needs. Will continue to follow the patient for status change: Discharge Planning: following for needs Expected discharge date: [id number] Marcelle SmilingRhonda Rubano, BSN, [place], [place]   [phone]  Golda Acreavis, Rhonda Lynn, RN 11/14/2015, 10:08 AM

## 2015-11-14 NOTE — Progress Notes (Signed)
Per Donnamarie PoagK. Kirby, NP, once patients blood sugar lower than 200, turn insulin drip off. Will follow out orders as instructed. Patient currently resting.

## 2015-11-14 NOTE — Progress Notes (Signed)
New orders placed to transition patient off insulin drip per Basil DessJ. Yates, MD. Order to give lantus, allow patient to eat, and keep on glucostabilizer x1-2hours. Will follow out orders. Pt currently eating. Will continue to monitor patient closely.

## 2015-11-14 NOTE — Progress Notes (Signed)
Assume care of patient. Agree with previous RN's assessment. Will continue to monitor patient.

## 2015-11-14 NOTE — Progress Notes (Signed)
NUTRITION EDUCATION NOTE  Pt identified by Consulting civil engineerCharge RN at rounds this AM as good candidate for nutrition education relating to new-onset diabetes mellitus.  Provided pt with "Carbohydrate Counting for People with Diabetes" handout and "Diabetes Label Reading Tips" handout from the Academy of Nutrition and Dietetic's Nutrition Care Manual.  Spoke with pt's girlfriend at bedside as pt slept. Pt's girlfriend receptive to nutrition education and states she plans to get him to "eat right" after discharge. Discussed carbohydrate-containing foods, reasons why carbohydrates should be monitored, label reading, and filling up with protein-rich foods and non-starchy vegetables. Also discussed importance of balancing carbohydrate intake throughout the day to prevent extremely high and low blood sugars.  Discussed that alcoholic beverages contain carbohydrates and that pt needs to monitor blood sugar when consuming these beverages.  Pt's girlfriend states she will need to rearrange her grocery list such that she purchases items pt can have such as sugar-free jello. Discussed avoiding regular sodas and switching to diet soda if appropriate. Pt's girlfriend appears motivated to help pt make these changes.  Teach-back method used.  Body mass index is 35.81 kg/m. BMI categorized as Obesity II.  Expect good compliance.  There are no other nutrition concerns at this time. If new nutrition needs arise, please consult RD.  Rosemarie AxKate Yaniyah Koors Dietetic Intern Pager Number: [phone]

## 2015-11-14 NOTE — Progress Notes (Signed)
Inpatient Diabetes Program Recommendations  AACE/ADA: New Consensus Statement on Inpatient Glycemic Control (2015)  Target Ranges:  Prepandial:   less than 140 mg/dL      Peak postprandial:   less than 180 mg/dL (1-2 hours)      Critically ill patients:  140 - 180 mg/dL   Lab Results  Component Value Date   GLUCAP 203 (H) 11/14/2015    Review of Glycemic Control  Diabetes history: newly-diagnosed DM Outpatient Diabetes medications: None Current orders for Inpatient glycemic control: Lantus 10 units QHS, Novolog moderate tidwc  35yo male with new-onset DM, HONK.  IV insulin per GlucoStabilizer and transitioned to basal-bolus insulin.  Inpatient Diabetes Program Recommendations:    Increase Lantus to 20 units QHS Add Novolog 4 units tidwc for meal coverage insulin Add HS correction  Spoke with patient about new diabetes diagnosis.  Discussed A1C results () and explained what an A1C is, and results are pending. Discussed basic pathophysiology of DM Type 2, basic home care, importance of checking CBGs and maintaining good CBG control to prevent long-term and short-term complications. Reviewed glucose and A1C goals and explained that patient will need to continue to  Reviewed signs and symptoms of hyperglycemia and hypoglycemia along with treatment for both. Discussed impact of nutrition, exercise, stress, sickness, and medications on diabetes control. Reviewed Living Well with diabetes booklet and encouraged patient to read through entire book. Informed patient that he will be prescribed Novolin 70/30 since it is more affordable. Encour[facility] to [facility] to get the Reli-On Prime glucometer for $9 and a box of 50 Reli-On test strips for $9. Asked patient to check his glucose 4 times per day (before meals and at bedtime) and to keep a log book of glucose readings and insulin taken. Explained how the doctor he follows up with can use the log book to continue to make insulin  adjustments if needed. Informed patient that RN will be asking him to self-administer insulin to ensure proper technique and ability to administer self insulin shots.   Discussed above with RN. Pt seems motivated to make lifestyle changes with diet, exercise and stress management.  Will order OP Diabetes Education consult.  Thank you. Ailene Ardshonda Lyndel Sarate, RD, LDN, CDE Inpatient Diabetes Coordinator [phone]

## 2015-11-14 NOTE — Progress Notes (Signed)
MD notified of CBG 439.

## 2015-11-14 NOTE — Progress Notes (Signed)
PROGRESS NOTE  Frank DollarDanneil Stutz  YQM:[id number] DOB: Feb 08, 1981 DOA: 11/13/2015 PCP: None per pt Outpatient Specialists: None  Brief Narrative: Frank Duncan is a 35 y.o. male with no known past medical history presenting on 10/5 with abdominal pain and emesis. This was preceded by a month of weakness, polyuria and polydipsia and worsened last week following binge drinking at a party. On arrival, glucose was 1000, pH on VBG 7.32, gap 15, bicarb 24, Cr 1.09. CBC unremarkable. UA uninfected with glucosuria and ketonuria. IV fluids and insulin infusion were started with improvement. Overnight, glucose was brought down under 250mg /dl, lantus and SSI were started and insulin infusion stopped earlier this morning. He has eaten without nausea.   Assessment & Plan: Principal Problem:   Type 2 diabetes mellitus with hyperosmolar nonketotic hyperglycemia (HCC) Active Problems:   Tobacco use disorder   Polysubstance abuse  New onset diabetes mellitus with hyperosmolar, hyperglycemic, non-ketotic state: Unknown duration of hyperglycemia due to no medical follow up. Hb A1c pending.  - Gap has closed, no acidosis, subjectively improved and eating: Will transfer to med-surg - Diabetes coordinator consulted for education.  - Lantus 10u given qHS, mod SSI TIDAC. Will titrate lantus dose based on insulin requirements.  - Patient requires additional day in the hospital to ensure no rebound of hyperglycemia and ensure adequate DM education - SW consult to assist patient with obtaining a PCP, and assistance affording insulin at discharge.  Tobacco dependence: 1ppd currently, ~20 pack-years. He is currently contemplative.  - Nicotine patch ordered, may require 21mg  dose. - Brief cessation counseling provided, will need follow up.  Polysubstance abuse: Describes regular binge drinking requiring several days of recovery and history of cocaine use. UDS +THC only. - Counseled about importance of limiting alcohol  intake for general health and in light of new Dx diabetes. Recommend he seek help if he is unable to quit on his own.  - Recommended cessation of all illicit substances.   DVT prophylaxis: Lovenox Code Status: Full Family Communication: Declined offer to call girlfriend.  Disposition Plan: Transfer to med-surg for CBG monitoring, insulin titration, and diabetes education. Likely D/C 10/7 if PCP follow up can be arranged next week.   Consultants:   Diabetes coordinator  Procedures:   None  Antimicrobials:  None   Subjective: Pt slept well, minor headache upon waking. Nausea, abd pain resolved. Denies ever having polyuria/polydipsia prior to 1 month ago. Uncle, mother, and MGM/MGF have diabetes, though he's not sure when they were diagnosed. Frequently drinks SSB's and abuses alcohol regularly. Strongly desires to go home.  Objective: Vitals:   11/14/15 0000 11/14/15 0404 11/14/15 0700 11/14/15 0800  BP:    124/68  Pulse:   60 72  Resp:      Temp: 98.6 F (37 C) 98 F (36.7 C)  97.6 F (36.4 C)  TempSrc: Oral Oral  Oral  SpO2:   95% 94%  Weight:      Height:        Intake/Output Summary (Last 24 hours) at 11/14/15 0914 Last data filed at 11/14/15 0800  Gross per 24 hour  Intake          1289.36 ml  Output              900 ml  Net           389.36 ml   Filed Weights   11/13/15 1717 11/13/15 2054  Weight: 108.9 kg (240 lb) 110 kg (242 lb 8.1 oz)  Examination: General exam: 35 y.o. male in no distress, sleeping quietly. Respiratory system: Non-labored breathing room air. Clear to auscultation bilaterally.  Cardiovascular system: Regular rate and rhythm. No murmur, rub, or gallop. No JVD, and no pedal edema. Gastrointestinal system: Abdomen soft, non-tender, non-distended, with normoactive bowel sounds. No organomegaly or masses felt. Central nervous system: Alert and oriented. No focal neurological deficits. Extremities: Warm, no deformities Skin: No rashes,  lesions, ulcers. No acanthosis noted. Psychiatry: Judgement and insight appear normal. Mood & affect appropriate.   Data Reviewed: I have personally reviewed following labs and imaging studies  CBC:  Recent Labs Lab 11/13/15 1715 11/14/15 0322  WBC 4.1 5.2  NEUTROABS 2.3  --   HGB 16.8 15.9  HCT 45.8 42.7  MCV 84.0 83.9  PLT 226 244   Basic Metabolic Panel:  Recent Labs Lab 11/13/15 1715 11/13/15 2211 11/14/15 0322  NA 122* 135 134*  K 4.4 3.5 3.3*  CL 83* 98* 98*  CO2 24 27 26   GLUCOSE 999* 330* 151*  BUN 14 10 12   CREATININE 1.09 0.80 0.72  CALCIUM 9.3 9.1 9.2   GFR: Estimated Creatinine Clearance: 156 mL/min (by C-G formula based on SCr of 0.72 mg/dL). Liver Function Tests:  Recent Labs Lab 11/13/15 1715  AST 15  ALT 20  ALKPHOS 94  BILITOT 1.3*  PROT 7.0  ALBUMIN 4.0    Recent Labs Lab 11/13/15 1715  LIPASE 38   No results for input(s): AMMONIA in the last 168 hours. Coagulation Profile: No results for input(s): INR, PROTIME in the last 168 hours. Cardiac Enzymes: No results for input(s): CKTOTAL, CKMB, CKMBINDEX, TROPONINI in the last 168 hours. BNP (last 3 results) No results for input(s): PROBNP in the last 8760 hours. HbA1C: No results for input(s): HGBA1C in the last 72 hours. CBG:  Recent Labs Lab 11/14/15 0056 11/14/15 0157 11/14/15 0258 11/14/15 0358 11/14/15 0739  GLUCAP 322* 327* 224* 125* 203*   Lipid Profile: No results for input(s): CHOL, HDL, LDLCALC, TRIG, CHOLHDL, LDLDIRECT in the last 72 hours. Thyroid Function Tests: No results for input(s): TSH, T4TOTAL, FREET4, T3FREE, THYROIDAB in the last 72 hours. Anemia Panel: No results for input(s): VITAMINB12, FOLATE, FERRITIN, TIBC, IRON, RETICCTPCT in the last 72 hours. Urine analysis:    Component Value Date/Time   COLORURINE YELLOW 11/13/2015 1714   APPEARANCEUR CLEAR 11/13/2015 1714   LABSPEC 1.031 (H) 11/13/2015 1714   PHURINE 5.0 11/13/2015 1714   GLUCOSEU  >1000 (A) 11/13/2015 1714   HGBUR NEGATIVE 11/13/2015 1714   BILIRUBINUR NEGATIVE 11/13/2015 1714   KETONESUR 15 (A) 11/13/2015 1714   PROTEINUR NEGATIVE 11/13/2015 1714   UROBILINOGEN 1.0 12/10/2014 0001   NITRITE NEGATIVE 11/13/2015 1714   LEUKOCYTESUR NEGATIVE 11/13/2015 1714   Sepsis Labs: @LABRCNTIP (procalcitonin:4,lacticidven:4)  ) Recent Results (from the past 240 hour(s))  MRSA PCR Screening     Status: None   Collection Time: 11/13/15  8:57 PM  Result Value Ref Range Status   MRSA by PCR NEGATIVE NEGATIVE Final    Comment:        The GeneXpert MRSA Assay (FDA approved for NASAL specimens only), is one component of a comprehensive MRSA colonization surveillance program. It is not intended to diagnose MRSA infection nor to guide or monitor treatment for MRSA infections.      Radiology Studies: No results found.  Scheduled Meds: . enoxaparin (LOVENOX) injection  55 mg Subcutaneous QHS  . Influenza vac split quadrivalent PF  0.5 mL Intramuscular Tomorrow-1000  .  insulin aspart  0-15 Units Subcutaneous TID WC  . insulin glargine  10 Units Subcutaneous QHS  . lisinopril  5 mg Oral Daily  . nicotine  14 mg Transdermal Daily   Continuous Infusions: . sodium chloride 100 mL/hr at 11/14/15 0239     LOS: 1 day   Time spent: 25 minutes.  Hazeline Junker, MD [facility] Pager [phone]  If 7PM-7AM, please contact night-coverage [email or web address] Password [email or web address] 11/14/2015, 9:14 AM

## 2015-11-15 ENCOUNTER — Inpatient Hospital Stay (HOSPITAL_COMMUNITY): Payer: Self-pay

## 2015-11-15 LAB — BASIC METABOLIC PANEL
ANION GAP: 7 (ref 5–15)
BUN: 11 mg/dL (ref 6–20)
CALCIUM: 8.4 mg/dL — AB (ref 8.9–10.3)
CO2: 26 mmol/L (ref 22–32)
Chloride: 100 mmol/L — ABNORMAL LOW (ref 101–111)
Creatinine, Ser: 0.88 mg/dL (ref 0.61–1.24)
GLUCOSE: 325 mg/dL — AB (ref 65–99)
Potassium: 3.9 mmol/L (ref 3.5–5.1)
Sodium: 133 mmol/L — ABNORMAL LOW (ref 135–145)

## 2015-11-15 LAB — GLUCOSE, CAPILLARY
GLUCOSE-CAPILLARY: 252 mg/dL — AB (ref 65–99)
Glucose-Capillary: 451 mg/dL — ABNORMAL HIGH (ref 65–99)
Glucose-Capillary: 323 mg/dL — ABNORMAL HIGH (ref 65–99)
Glucose-Capillary: 317 mg/dL — ABNORMAL HIGH (ref 65–99)
Glucose-Capillary: 301 mg/dL — ABNORMAL HIGH (ref 65–99)

## 2015-11-15 MED ORDER — INSULIN ASPART 100 UNIT/ML ~~LOC~~ SOLN
4.0000 [IU] | Freq: Three times a day (TID) | SUBCUTANEOUS | Status: DC
  Administered 2015-11-15 – 2015-11-17 (×7): 4 [IU] via SUBCUTANEOUS

## 2015-11-15 MED ORDER — INSULIN ASPART 100 UNIT/ML ~~LOC~~ SOLN
0.0000 [IU] | Freq: Three times a day (TID) | SUBCUTANEOUS | Status: DC
  Administered 2015-11-15 (×2): 15 [IU] via SUBCUTANEOUS
  Administered 2015-11-16: 7 [IU] via SUBCUTANEOUS
  Administered 2015-11-16: 15 [IU] via SUBCUTANEOUS
  Administered 2015-11-16: 11 [IU] via SUBCUTANEOUS
  Administered 2015-11-17 (×2): 7 [IU] via SUBCUTANEOUS
  Administered 2015-11-17: 11 [IU] via SUBCUTANEOUS
  Administered 2015-11-18: 4 [IU] via SUBCUTANEOUS
  Administered 2015-11-18: 11 [IU] via SUBCUTANEOUS

## 2015-11-15 MED ORDER — ALBUTEROL SULFATE (2.5 MG/3ML) 0.083% IN NEBU: 2.5000 mg | INHALATION_SOLUTION | RESPIRATORY_TRACT | Status: DC | PRN

## 2015-11-15 MED ORDER — INSULIN ASPART 100 UNIT/ML ~~LOC~~ SOLN
5.0000 [IU] | Freq: Once | SUBCUTANEOUS | Status: AC
Start: 2015-11-15 — End: 2015-11-15
  Administered 2015-11-15: 5 [IU] via SUBCUTANEOUS

## 2015-11-15 MED ORDER — INSULIN ASPART 100 UNIT/ML ~~LOC~~ SOLN
0.0000 [IU] | Freq: Every day | SUBCUTANEOUS | Status: DC
  Administered 2015-11-15 – 2015-11-16 (×2): 3 [IU] via SUBCUTANEOUS

## 2015-11-15 MED ORDER — INSULIN GLARGINE 100 UNIT/ML ~~LOC~~ SOLN
35.0000 [IU] | Freq: Every day | SUBCUTANEOUS | Status: DC
  Administered 2015-11-15: 35 [IU] via SUBCUTANEOUS
  Filled 2015-11-15 (×2): qty 0.35

## 2015-11-15 NOTE — Progress Notes (Addendum)
PROGRESS NOTE  Frank DollarDanneil Yapp  WUJ:[id number] DOB: 11/14/1980 DOA: 11/13/2015 PCP: None per pt Outpatient Specialists: None  Brief Narrative: Frank Duncan is a 35 y.o. male with no known past medical history presenting on 10/5 with abdominal pain and emesis. This was preceded by a month of weakness, polyuria and polydipsia and worsened last week following binge drinking at a party. On arrival, glucose was 1000, pH on VBG 7.32, gap 15, bicarb 24, Cr 1.09. CBC unremarkable. UA uninfected with glucosuria and ketonuria. IV fluids and insulin infusion were started with improvement. Overnight, glucose was brought down under 250mg /dl, lantus and SSI were started and insulin infusion stopped earlier this morning. He has eaten without nausea.   Assessment & Plan: Principal Problem:   Type 2 diabetes mellitus with hyperosmolar nonketotic hyperglycemia (HCC) Active Problems:   Tobacco use disorder   Polysubstance abuse  New onset diabetes mellitus with hyperosmolar, hyperglycemic, non-ketotic state: Unknown duration of hyperglycemia due to no medical follow up. Hb A1c pending, but anticipate this will be high given insulin requirement. Requiring significant insulin without adequate control - Increased lantus to 20u last night, no readings < 300 today, has been given 39 units as of lunch time. Will increase lantus to 35u tonight (adding 1/3 of what I anticipate will be at least 45 units required today).  - Added mealtime and HS coverage - Diabetes coordinator providing education. Pt self-administering insulin. - Added low dose lisinopril, BP maintaining. - Patient requires additional day in the hospital to manage hyperglycemia, titrate insulin, and ensure adequate DM education - CM consult to assist patient with obtaining a PCP, and assistance affording insulin at discharge.  - Outpatient diabetes education consulted  Cough: Acute on chronic, with some wheezing on exam diffusely. No h/o COPD, asthma, or  use of inhalers in past. No fever, orthopnea.  - CXR - Duoneb prn.  Tobacco dependence: 1ppd currently, ~20 pack-years. He is currently contemplative.  - Nicotine patch ordered, may require 21mg  dose. - Brief cessation counseling provided, will need follow up.  Polysubstance abuse: Describes regular binge drinking requiring several days of recovery and history of cocaine use. UDS +THC only. - Counseled about importance of limiting alcohol intake for general health and in light of new Dx diabetes. Recommend he seek help if he is unable to quit on his own.  - Recommended cessation of all illicit substances.   DVT prophylaxis: Lovenox Code Status: Full Family Communication: Girlfriend, Rosey Batheresa, updated while sharing bed with pt. Disposition Plan: Ongoing CBG monitoring, insulin titration, and diabetes education. Likely D/C 10/9.  Consultants:   Diabetes coordinator, Marcelle Smilinghonda Longenecker  Procedures:   None  Antimicrobials:  None   Subjective: Pt slept well, no complaints. Gained much from diabetes education.   Objective: Vitals:   11/14/15 1500 11/14/15 1617 11/14/15 2135 11/15/15 0631  BP:  119/73 125/70 134/77  Pulse: 64 64 73 66  Resp: (!) 23 19 18 17   Temp:  97.8 F (36.6 C) 97.8 F (36.6 C) 97.5 F (36.4 C)  TempSrc:  Oral Oral Oral  SpO2: 98% 100% 99% 99%  Weight:      Height:        Intake/Output Summary (Last 24 hours) at 11/15/15 1313 Last data filed at 11/15/15 0600  Gross per 24 hour  Intake             2320 ml  Output                0 ml  Net  2320 ml   Filed Weights   11/13/15 1717 11/13/15 2054  Weight: 108.9 kg (240 lb) 110 kg (242 lb 8.1 oz)    Examination: General exam: 35 y.o. male in no distress, sleeping quietly. Respiratory system: Non-labored breathing room air. Diffuse wheezing with good air movement, normal expiration duration. Occasional cough. Cardiovascular system: Regular rate and rhythm. No murmur, rub, or gallop. No JVD, and  no pedal edema. Gastrointestinal system: Abdomen soft, non-tender, non-distended, with normoactive bowel sounds. No organomegaly or masses felt. Central nervous system: Alert and oriented. No focal neurological deficits. Extremities: Warm, no deformities Skin: No rashes, lesions, ulcers. No acanthosis noted. Psychiatry: Judgement and insight appear normal. Mood & affect appropriate.   Data Reviewed: I have personally reviewed following labs and imaging studies  CBC:  Recent Labs Lab 11/13/15 1715 11/14/15 0322  WBC 4.1 5.2  NEUTROABS 2.3  --   HGB 16.8 15.9  HCT 45.8 42.7  MCV 84.0 83.9  PLT 226 244   Basic Metabolic Panel:  Recent Labs Lab 11/13/15 1715 11/13/15 2211 11/14/15 0322 11/15/15 0639  NA 122* 135 134* 133*  K 4.4 3.5 3.3* 3.9  CL 83* 98* 98* 100*  CO2 24 27 26 26   GLUCOSE 999* 330* 151* 325*  BUN 14 10 12 11   CREATININE 1.09 0.80 0.72 0.88  CALCIUM 9.3 9.1 9.2 8.4*   GFR: Estimated Creatinine Clearance: 141.8 mL/min (by C-G formula based on SCr of 0.88 mg/dL). Liver Function Tests:  Recent Labs Lab 11/13/15 1715  AST 15  ALT 20  ALKPHOS 94  BILITOT 1.3*  PROT 7.0  ALBUMIN 4.0    Recent Labs Lab 11/13/15 1715  LIPASE 38   No results for input(s): AMMONIA in the last 168 hours. Coagulation Profile: No results for input(s): INR, PROTIME in the last 168 hours. Cardiac Enzymes: No results for input(s): CKTOTAL, CKMB, CKMBINDEX, TROPONINI in the last 168 hours. BNP (last 3 results) No results for input(s): PROBNP in the last 8760 hours. HbA1C: No results for input(s): HGBA1C in the last 72 hours. CBG:  Recent Labs Lab 11/14/15 1751 11/14/15 2137 11/15/15 0221 11/15/15 0742 11/15/15 1208  GLUCAP 439* 361* 451* 323* 317*   Lipid Profile: No results for input(s): CHOL, HDL, LDLCALC, TRIG, CHOLHDL, LDLDIRECT in the last 72 hours. Thyroid Function Tests: No results for input(s): TSH, T4TOTAL, FREET4, T3FREE, THYROIDAB in the last 72  hours. Anemia Panel: No results for input(s): VITAMINB12, FOLATE, FERRITIN, TIBC, IRON, RETICCTPCT in the last 72 hours. Urine analysis:    Component Value Date/Time   COLORURINE YELLOW 11/13/2015 1714   APPEARANCEUR CLEAR 11/13/2015 1714   LABSPEC 1.031 (H) 11/13/2015 1714   PHURINE 5.0 11/13/2015 1714   GLUCOSEU >1000 (A) 11/13/2015 1714   HGBUR NEGATIVE 11/13/2015 1714   BILIRUBINUR NEGATIVE 11/13/2015 1714   KETONESUR 15 (A) 11/13/2015 1714   PROTEINUR NEGATIVE 11/13/2015 1714   UROBILINOGEN 1.0 12/10/2014 0001   NITRITE NEGATIVE 11/13/2015 1714   LEUKOCYTESUR NEGATIVE 11/13/2015 1714   Sepsis Labs: @LABRCNTIP (procalcitonin:4,lacticidven:4)  ) Recent Results (from the past 240 hour(s))  MRSA PCR Screening     Status: None   Collection Time: 11/13/15  8:57 PM  Result Value Ref Range Status   MRSA by PCR NEGATIVE NEGATIVE Final    Comment:        The GeneXpert MRSA Assay (FDA approved for NASAL specimens only), is one component of a comprehensive MRSA colonization surveillance program. It is not intended to diagnose MRSA infection nor to  guide or monitor treatment for MRSA infections.      Radiology Studies: No results found.  Scheduled Meds: . enoxaparin (LOVENOX) injection  55 mg Subcutaneous QHS  . Influenza vac split quadrivalent PF  0.5 mL Intramuscular Tomorrow-1000  . insulin aspart  0-20 Units Subcutaneous TID WC  . insulin aspart  0-5 Units Subcutaneous QHS  . insulin aspart  4 Units Subcutaneous TID WC  . insulin glargine  20 Units Subcutaneous QHS  . lisinopril  5 mg Oral Daily  . nicotine  14 mg Transdermal Daily   Continuous Infusions: . sodium chloride 100 mL/hr at 11/14/15 0239     LOS: 2 days   Time spent: 25 minutes.  Hazeline Junker, MD [facility] Pager [phone]  If 7PM-7AM, please contact night-coverage [email or web address] Password [email or web address] 11/15/2015, 1:13 PM

## 2015-11-16 LAB — BASIC METABOLIC PANEL
ANION GAP: 8 (ref 5–15)
BUN: 9 mg/dL (ref 6–20)
CHLORIDE: 102 mmol/L (ref 101–111)
CO2: 26 mmol/L (ref 22–32)
Calcium: 9 mg/dL (ref 8.9–10.3)
Creatinine, Ser: 0.84 mg/dL (ref 0.61–1.24)
GFR calc non Af Amer: >60 mL/min (ref >60)
Glucose, Bld: 301 mg/dL — ABNORMAL HIGH (ref 65–99)
POTASSIUM: 3.7 mmol/L (ref 3.5–5.1)
Sodium: 136 mmol/L (ref 135–145)

## 2015-11-16 LAB — GLUCOSE, CAPILLARY
GLUCOSE-CAPILLARY: 250 mg/dL — AB (ref 65–99)
GLUCOSE-CAPILLARY: 270 mg/dL — AB (ref 65–99)
GLUCOSE-CAPILLARY: 258 mg/dL — AB (ref 65–99)
Glucose-Capillary: 324 mg/dL — ABNORMAL HIGH (ref 65–99)

## 2015-11-16 LAB — HEMOGLOBIN A1C: Mean Plasma Glucose: >398 mg/dL

## 2015-11-16 MED ORDER — INSULIN GLARGINE 100 UNIT/ML ~~LOC~~ SOLN
50.0000 [IU] | Freq: Every day | SUBCUTANEOUS | Status: DC
  Administered 2015-11-16: 50 [IU] via SUBCUTANEOUS
  Filled 2015-11-16 (×2): qty 0.5

## 2015-11-16 MED ORDER — LISINOPRIL 10 MG PO TABS
10.0000 mg | ORAL_TABLET | Freq: Every day | ORAL | Status: DC
  Administered 2015-11-16 – 2015-11-18 (×3): 10 mg via ORAL
  Filled 2015-11-16 (×3): qty 1

## 2015-11-16 NOTE — Progress Notes (Signed)
PROGRESS NOTE  Frank Duncan  ZOX:[id number] DOB: November 05, 1980 DOA: 11/13/2015 PCP: None per pt Outpatient Specialists: None  Brief Narrative: Frank Duncan is a 35 y.o. male with no known past medical history presenting on 10/5 with abdominal pain and emesis. This was preceded by a month of weakness, polyuria and polydipsia and worsened last week following binge drinking at a party. On arrival, glucose was 1000, pH on VBG 7.32, gap 15, bicarb 24, Cr 1.09. CBC unremarkable. UA uninfected with glucosuria and ketonuria. IV fluids and insulin infusion were started with improvement. Overnight, glucose was brought down under 250mg /dl, lantus and SSI were started and insulin infusion stopped earlier this morning. He has eaten without nausea. Basal lantus and bolus novolog were started and titrated upward with uncontrolled hyperglycemia.    Assessment & Plan: Principal Problem:   Type 2 diabetes mellitus with hyperosmolar nonketotic hyperglycemia (HCC) Active Problems:   Tobacco use disorder   Polysubstance abuse  New onset diabetes mellitus with hyperosmolar, hyperglycemic, non-ketotic state: Unknown duration of hyperglycemia due to no medical follow up. Hb A1c pending, but anticipate this will be high given insulin requirement. Requiring significant insulin without adequate control - Increased lantus to 35u last night, will follow novolog requirements today and adjust PM dose as required.  - Continue mealtime and HS coverage - Diabetes coordinator providing education. Pt self-administering insulin. - Added low dose lisinopril, some readings SBP > 140, increase to 10mg  dose.  - Patient requires additional day in the hospital to manage hyperglycemia, titrate insulin, and ensure adequate DM education - CM consult to assist patient with obtaining a PCP, and assistance affording insulin at discharge.  - Outpatient diabetes education consulted  Cough: Acute on chronic, with some wheezing on exam  diffusely. No h/o COPD, asthma, or use of inhalers in past. No fever, orthopnea.  - CXR neg.  - Duoneb prn.  Tobacco dependence: 1ppd currently, ~20 pack-years. He is currently contemplative.  - Nicotine patch ordered, may require 21mg  dose. - Brief cessation counseling provided, will need follow up.  Polysubstance abuse: Describes regular binge drinking requiring several days of recovery and history of cocaine use. UDS +THC only. - Counseled about importance of limiting alcohol intake for general health and in light of new Dx diabetes. Recommend he seek help if he is unable to quit on his own.  - Recommended cessation of all illicit substances.   DVT prophylaxis: Lovenox Code Status: Full Family Communication: Girlfriend, Rosey Batheresa. Disposition Plan: Ongoing CBG monitoring, insulin titration, and diabetes education. Likely D/C 10/9.  Consultants:   Diabetes coordinator, Marcelle Smilinghonda Buckhalter  Procedures:   None  Antimicrobials:  None   Subjective: Pt slept well, frustrated with high CBGs. Eating 100% of meals and some snacks. No chest pain, dyspnea. Cough improved.    Objective: Vitals:   11/15/15 0631 11/15/15 1500 11/15/15 2029 11/16/15 0444  BP: 134/77 (!) 143/99 (!) 147/86 127/83  Pulse: 66 73 70 65  Resp: 17 18 16 17   Temp: 97.5 F (36.4 C) 97.8 F (36.6 C) 98.3 F (36.8 C) 98 F (36.7 C)  TempSrc: Oral Oral Oral Oral  SpO2: 99% 100% 100% 100%  Weight:      Height:        Intake/Output Summary (Last 24 hours) at 11/16/15 0852 Last data filed at 11/15/15 1300  Gross per 24 hour  Intake              240 ml  Output  0 ml  Net              240 ml   Filed Weights   11/13/15 1717 11/13/15 2054  Weight: 108.9 kg (240 lb) 110 kg (242 lb 8.1 oz)    Examination: General exam: 35 y.o. male in no distress, sleeping quietly. Respiratory system: Non-labored breathing room air. Normal air exchange with only mild scattered wheezing. No crackles or  rhonchi. Cardiovascular system: Regular rate and rhythm. No murmur, rub, or gallop. No JVD, and no pedal edema. Gastrointestinal system: Abdomen soft, non-tender, non-distended, with normoactive bowel sounds. No organomegaly or masses felt. Central nervous system: Alert and oriented. No focal neurological deficits. Extremities: Warm, no deformities Skin: No rashes, lesions, ulcers. No acanthosis noted. Psychiatry: Judgement and insight appear normal. Mood & affect appropriate.   Data Reviewed: I have personally reviewed following labs and imaging studies  CBC:  Recent Labs Lab 11/13/15 1715 11/14/15 0322  WBC 4.1 5.2  NEUTROABS 2.3  --   HGB 16.8 15.9  HCT 45.8 42.7  MCV 84.0 83.9  PLT 226 244   Basic Metabolic Panel:  Recent Labs Lab 11/13/15 1715 11/13/15 2211 11/14/15 0322 11/15/15 0639 11/16/15 0621  NA 122* 135 134* 133* 136  K 4.4 3.5 3.3* 3.9 3.7  CL 83* 98* 98* 100* 102  CO2 24 27 26 26 26   GLUCOSE 999* 330* 151* 325* 301*  BUN 14 10 12 11 9   CREATININE 1.09 0.80 0.72 0.88 0.84  CALCIUM 9.3 9.1 9.2 8.4* 9.0   GFR: Estimated Creatinine Clearance: 148.6 mL/min (by C-G formula based on SCr of 0.84 mg/dL). Liver Function Tests:  Recent Labs Lab 11/13/15 1715  AST 15  ALT 20  ALKPHOS 94  BILITOT 1.3*  PROT 7.0  ALBUMIN 4.0    Recent Labs Lab 11/13/15 1715  LIPASE 38   No results for input(s): AMMONIA in the last 168 hours. Coagulation Profile: No results for input(s): INR, PROTIME in the last 168 hours. Cardiac Enzymes: No results for input(s): CKTOTAL, CKMB, CKMBINDEX, TROPONINI in the last 168 hours. BNP (last 3 results) No results for input(s): PROBNP in the last 8760 hours. HbA1C: No results for input(s): HGBA1C in the last 72 hours. CBG:  Recent Labs Lab 11/15/15 0742 11/15/15 1208 11/15/15 1656 11/15/15 2023 11/16/15 0718  GLUCAP 323* 317* 301* 252* 324*   Lipid Profile: No results for input(s): CHOL, HDL, LDLCALC, TRIG,  CHOLHDL, LDLDIRECT in the last 72 hours. Thyroid Function Tests: No results for input(s): TSH, T4TOTAL, FREET4, T3FREE, THYROIDAB in the last 72 hours. Anemia Panel: No results for input(s): VITAMINB12, FOLATE, FERRITIN, TIBC, IRON, RETICCTPCT in the last 72 hours. Urine analysis:    Component Value Date/Time   COLORURINE YELLOW 11/13/2015 1714   APPEARANCEUR CLEAR 11/13/2015 1714   LABSPEC 1.031 (H) 11/13/2015 1714   PHURINE 5.0 11/13/2015 1714   GLUCOSEU >1000 (A) 11/13/2015 1714   HGBUR NEGATIVE 11/13/2015 1714   BILIRUBINUR NEGATIVE 11/13/2015 1714   KETONESUR 15 (A) 11/13/2015 1714   PROTEINUR NEGATIVE 11/13/2015 1714   UROBILINOGEN 1.0 12/10/2014 0001   NITRITE NEGATIVE 11/13/2015 1714   LEUKOCYTESUR NEGATIVE 11/13/2015 1714   Sepsis Labs: @LABRCNTIP (procalcitonin:4,lacticidven:4)  ) Recent Results (from the past 240 hour(s))  MRSA PCR Screening     Status: None   Collection Time: 11/13/15  8:57 PM  Result Value Ref Range Status   MRSA by PCR NEGATIVE NEGATIVE Final    Comment:        The GeneXpert  MRSA Assay (FDA approved for NASAL specimens only), is one component of a comprehensive MRSA colonization surveillance program. It is not intended to diagnose MRSA infection nor to guide or monitor treatment for MRSA infections.      Radiology Studies: Dg Chest 2 View  Result Date: 11/15/2015 CLINICAL DATA:  Cough and burning sensation in chest EXAM: CHEST  2 VIEW COMPARISON:  None. FINDINGS: Lungs are clear. Heart size and pulmonary vascularity are normal. No adenopathy. There is thoracic lordosis. No pneumothorax. IMPRESSION: No edema or consolidation. Electronically Signed   By: Bretta Bang III M.D.   On: 11/15/2015 15:41    Scheduled Meds: . enoxaparin (LOVENOX) injection  55 mg Subcutaneous QHS  . Influenza vac split quadrivalent PF  0.5 mL Intramuscular Tomorrow-1000  . insulin aspart  0-20 Units Subcutaneous TID WC  . insulin aspart  0-5 Units  Subcutaneous QHS  . insulin aspart  4 Units Subcutaneous TID WC  . insulin glargine  35 Units Subcutaneous QHS  . lisinopril  5 mg Oral Daily  . nicotine  14 mg Transdermal Daily   Continuous Infusions:    LOS: 3 days   Time spent: 25 minutes.  Hazeline Junker, MD [facility] Pager [phone]  If 7PM-7AM, please contact night-coverage [email or web address] Password [email or web address] 11/16/2015, 8:52 AM

## 2015-11-17 ENCOUNTER — Encounter (HOSPITAL_COMMUNITY): Payer: Self-pay

## 2015-11-17 LAB — BASIC METABOLIC PANEL
Anion gap: 9 (ref 5–15)
BUN: 12 mg/dL (ref 6–20)
CALCIUM: 8.9 mg/dL (ref 8.9–10.3)
CO2: 26 mmol/L (ref 22–32)
CREATININE: 0.9 mg/dL (ref 0.61–1.24)
Chloride: 100 mmol/L — ABNORMAL LOW (ref 101–111)
Glucose, Bld: 241 mg/dL — ABNORMAL HIGH (ref 65–99)
Potassium: 3.8 mmol/L (ref 3.5–5.1)
SODIUM: 135 mmol/L (ref 135–145)

## 2015-11-17 LAB — GLUCOSE, CAPILLARY
GLUCOSE-CAPILLARY: 233 mg/dL — AB (ref 65–99)
GLUCOSE-CAPILLARY: 266 mg/dL — AB (ref 65–99)
GLUCOSE-CAPILLARY: 205 mg/dL — AB (ref 65–99)
GLUCOSE-CAPILLARY: 165 mg/dL — AB (ref 65–99)

## 2015-11-17 MED ORDER — METFORMIN HCL 500 MG PO TABS
500.0000 mg | ORAL_TABLET | Freq: Every day | ORAL | Status: DC
  Administered 2015-11-17 – 2015-11-18 (×2): 500 mg via ORAL
  Filled 2015-11-17 (×2): qty 1

## 2015-11-17 MED ORDER — INSULIN ASPART 100 UNIT/ML ~~LOC~~ SOLN
6.0000 [IU] | Freq: Three times a day (TID) | SUBCUTANEOUS | Status: DC
  Administered 2015-11-17 – 2015-11-18 (×3): 6 [IU] via SUBCUTANEOUS

## 2015-11-17 MED ORDER — INSULIN GLARGINE 100 UNIT/ML ~~LOC~~ SOLN
55.0000 [IU] | Freq: Every day | SUBCUTANEOUS | Status: DC
  Administered 2015-11-17: 55 [IU] via SUBCUTANEOUS
  Filled 2015-11-17 (×2): qty 0.55

## 2015-11-17 NOTE — Progress Notes (Signed)
Patient was educated on checking his blood sugar and administering insulin. He demonstrated back well with both and needs more practicing with administering insulin. Also, patient was given instructions on how to watch the educational videos for self administering insulin and how to manage diabetes. Patient reports that he did not want to watch the videos at this time and will watch them at night.

## 2015-11-17 NOTE — Progress Notes (Signed)
PROGRESS NOTE  Frank DollarDanneil Schild  OZH:[id number] DOB: October 01, 1980 DOA: 11/13/2015 PCP: None per pt Outpatient Specialists: None  Brief Narrative: Frank Duncan is a 35 y.o. male with no known past medical history presenting on 10/5 with abdominal pain and emesis. This was preceded by a month of weakness, polyuria and polydipsia and worsened last week following binge drinking at a party. On arrival, glucose was 1000, pH on VBG 7.32, gap 15, bicarb 24, Cr 1.09. CBC unremarkable. UA uninfected with glucosuria and ketonuria. IV fluids and insulin infusion were started with improvement. Overnight, glucose was brought down under 250mg /dl, lantus and SSI were started and insulin infusion stopped earlier this morning. He has eaten without nausea. Basal lantus and bolus novolog were started and titrated upward with uncontrolled hyperglycemia.    Assessment & Plan: Principal Problem:   Type 2 diabetes mellitus with hyperosmolar nonketotic hyperglycemia (HCC) Active Problems:   Tobacco use disorder   Polysubstance abuse  New onset diabetes mellitus with hyperosmolar, hyperglycemic, non-ketotic state: Unknown duration of hyperglycemia due to no medical follow up. Hb A1c pending, but anticipate this will be high given insulin requirement. Requiring significant insulin without adequate control - Lantus dose increased to 50u qHS with improvement of hyperglycemia, still not at goal. Will increase tonight based on short-acting use. - Continue mealtime and HS coverage - Diabetes coordinator providing education. Pt self-administering insulin. - Added low dose lisinopril, titrated to 10mg  dose.  - CM consult to assist patient with obtaining a PCP, and assistance affording insulin at discharge.  - Outpatient diabetes education consulted - Lipid panel in AM. Defer statin inception to PCP.  Tobacco dependence: 1ppd currently, ~20 pack-years. He is currently contemplative. Chronic cough. - Nicotine patch ordered, may  require 21mg  dose. - Brief cessation counseling provided, will need follow up. - CXR neg. - Duoneb prn.   Polysubstance abuse: Describes regular binge drinking requiring several days of recovery and history of cocaine use. UDS +THC only. - Counseled about importance of limiting alcohol intake for general health and in light of new Dx diabetes. Recommend he seek help if he is unable to quit on his own.  - Recommended cessation of all illicit substances.   DVT prophylaxis: Lovenox Code Status: Full Family Communication: None at bedside today. Disposition Plan: Requires floor management with ongoing CBG monitoring, insulin titration, and diabetes education.  Consultants:   Diabetes coordinator, Pamala Hurryhonda Vaughn  Procedures:   None  Antimicrobials:  None   Subjective: Pt feels well. Has not been injecting insulin, but will start today. Has injected a family member in the past. No chest pain, dyspnea. Cough improved.    Objective: Vitals:   11/16/15 0444 11/16/15 1500 11/16/15 2141 11/17/15 0505  BP: 127/83 (!) 103/48 116/69 128/87  Pulse: 65 70 (!) 59 67  Resp: 17 16 17 18   Temp: 98 F (36.7 C) 98.1 F (36.7 C) 97.7 F (36.5 C) 98.7 F (37.1 C)  TempSrc: Oral Oral Oral Oral  SpO2: 100% 100% 100% 99%  Weight:      Height:        Intake/Output Summary (Last 24 hours) at 11/17/15 1311 Last data filed at 11/16/15 1700  Gross per 24 hour  Intake              480 ml  Output                0 ml  Net              480  ml   Filed Weights   11/13/15 1717 11/13/15 2054  Weight: 108.9 kg (240 lb) 110 kg (242 lb 8.1 oz)   Examination: General exam: 35 y.o. male in no distress, sleeping quietly. Respiratory system: Non-labored breathing room air. Normal air exchange with only mild scattered wheezing. No crackles or rhonchi. Cardiovascular system: Regular rate and rhythm. No murmur, rub, or gallop. No JVD, and no pedal edema. Gastrointestinal system: Abdomen soft, non-tender,  non-distended, with normoactive bowel sounds. No organomegaly or masses felt. Central nervous system: Alert and oriented. No focal neurological deficits. Extremities: Warm, no deformities Skin: No rashes, lesions, ulcers. No acanthosis noted. Psychiatry: Judgement and insight appear normal. Mood & affect appropriate.   Data Reviewed: I have personally reviewed following labs and imaging studies  CBC:  Recent Labs Lab 11/13/15 1715 11/14/15 0322  WBC 4.1 5.2  NEUTROABS 2.3  --   HGB 16.8 15.9  HCT 45.8 42.7  MCV 84.0 83.9  PLT 226 244   Basic Metabolic Panel:  Recent Labs Lab 11/13/15 2211 11/14/15 0322 11/15/15 0639 11/16/15 0621 11/17/15 0511  NA 135 134* 133* 136 135  K 3.5 3.3* 3.9 3.7 3.8  CL 98* 98* 100* 102 100*  CO2 27 26 26 26 26   GLUCOSE 330* 151* 325* 301* 241*  BUN 10 12 11 9 12   CREATININE 0.80 0.72 0.88 0.84 0.90  CALCIUM 9.1 9.2 8.4* 9.0 8.9   GFR: Estimated Creatinine Clearance: 138.7 mL/min (by C-G formula based on SCr of 0.9 mg/dL). Liver Function Tests:  Recent Labs Lab 11/13/15 1715  AST 15  ALT 20  ALKPHOS 94  BILITOT 1.3*  PROT 7.0  ALBUMIN 4.0    Recent Labs Lab 11/13/15 1715  LIPASE 38   No results for input(s): AMMONIA in the last 168 hours. Coagulation Profile: No results for input(s): INR, PROTIME in the last 168 hours. Cardiac Enzymes: No results for input(s): CKTOTAL, CKMB, CKMBINDEX, TROPONINI in the last 168 hours. BNP (last 3 results) No results for input(s): PROBNP in the last 8760 hours. HbA1C: No results for input(s): HGBA1C in the last 72 hours. CBG:  Recent Labs Lab 11/16/15 1157 11/16/15 1633 11/16/15 2135 11/17/15 0728 11/17/15 1147  GLUCAP 250* 270* 258* 233* 266*   Lipid Profile: No results for input(s): CHOL, HDL, LDLCALC, TRIG, CHOLHDL, LDLDIRECT in the last 72 hours. Thyroid Function Tests: No results for input(s): TSH, T4TOTAL, FREET4, T3FREE, THYROIDAB in the last 72 hours. Anemia  Panel: No results for input(s): VITAMINB12, FOLATE, FERRITIN, TIBC, IRON, RETICCTPCT in the last 72 hours. Urine analysis:    Component Value Date/Time   COLORURINE YELLOW 11/13/2015 1714   APPEARANCEUR CLEAR 11/13/2015 1714   LABSPEC 1.031 (H) 11/13/2015 1714   PHURINE 5.0 11/13/2015 1714   GLUCOSEU >1000 (A) 11/13/2015 1714   HGBUR NEGATIVE 11/13/2015 1714   BILIRUBINUR NEGATIVE 11/13/2015 1714   KETONESUR 15 (A) 11/13/2015 1714   PROTEINUR NEGATIVE 11/13/2015 1714   UROBILINOGEN 1.0 12/10/2014 0001   NITRITE NEGATIVE 11/13/2015 1714   LEUKOCYTESUR NEGATIVE 11/13/2015 1714   Sepsis Labs: @LABRCNTIP (procalcitonin:4,lacticidven:4)  ) Recent Results (from the past 240 hour(s))  MRSA PCR Screening     Status: None   Collection Time: 11/13/15  8:57 PM  Result Value Ref Range Status   MRSA by PCR NEGATIVE NEGATIVE Final    Comment:        The GeneXpert MRSA Assay (FDA approved for NASAL specimens only), is one component of a comprehensive MRSA colonization surveillance program. It  is not intended to diagnose MRSA infection nor to guide or monitor treatment for MRSA infections.      Radiology Studies: Dg Chest 2 View  Result Date: 11/15/2015 CLINICAL DATA:  Cough and burning sensation in chest EXAM: CHEST  2 VIEW COMPARISON:  None. FINDINGS: Lungs are clear. Heart size and pulmonary vascularity are normal. No adenopathy. There is thoracic lordosis. No pneumothorax. IMPRESSION: No edema or consolidation. Electronically Signed   By: Bretta Bang III M.D.   On: 11/15/2015 15:41    Scheduled Meds: . enoxaparin (LOVENOX) injection  55 mg Subcutaneous QHS  . Influenza vac split quadrivalent PF  0.5 mL Intramuscular Tomorrow-1000  . insulin aspart  0-20 Units Subcutaneous TID WC  . insulin aspart  0-5 Units Subcutaneous QHS  . insulin aspart  4 Units Subcutaneous TID WC  . insulin glargine  50 Units Subcutaneous QHS  . lisinopril  10 mg Oral Daily  . nicotine  14 mg  Transdermal Daily   Continuous Infusions:    LOS: 4 days   Time spent: 25 minutes.  Hazeline Junker, MD [facility] Pager [phone]  If 7PM-7AM, please contact night-coverage [email or web address] Password [email or web address] 11/17/2015, 1:11 PM

## 2015-11-18 LAB — BASIC METABOLIC PANEL
Anion gap: 5 (ref 5–15)
BUN: 11 mg/dL (ref 6–20)
CHLORIDE: 103 mmol/L (ref 101–111)
CO2: 28 mmol/L (ref 22–32)
Calcium: 8.9 mg/dL (ref 8.9–10.3)
Creatinine, Ser: 0.83 mg/dL (ref 0.61–1.24)
Glucose, Bld: 326 mg/dL — ABNORMAL HIGH (ref 65–99)
POTASSIUM: 4.2 mmol/L (ref 3.5–5.1)
SODIUM: 136 mmol/L (ref 135–145)

## 2015-11-18 LAB — GLUCOSE, CAPILLARY
Glucose-Capillary: 271 mg/dL — ABNORMAL HIGH (ref 65–99)
Glucose-Capillary: 174 mg/dL — ABNORMAL HIGH (ref 65–99)

## 2015-11-18 LAB — LIPID PANEL
CHOL/HDL RATIO: 6.7 ratio
CHOLESTEROL: 288 mg/dL — AB (ref 0–200)
HDL: 43 mg/dL (ref >40)
LDL Cholesterol: 203 mg/dL — ABNORMAL HIGH (ref 0–99)
TRIGLYCERIDES: 211 mg/dL — AB (ref <150)
VLDL: 42 mg/dL — AB (ref 0–40)

## 2015-11-18 MED ORDER — INSULIN STARTER KIT- SYRINGES (ENGLISH): 1.0000 | Freq: Once | 0 refills | Status: AC

## 2015-11-18 MED ORDER — INSULIN GLARGINE 100 UNIT/ML ~~LOC~~ SOLN: 55.0000 [IU] | Freq: Every day | SUBCUTANEOUS | 11 refills | Status: DC

## 2015-11-18 MED ORDER — INSULIN STARTER KIT- SYRINGES (ENGLISH)
1.0000 | Freq: Once | Status: AC
  Administered 2015-11-18: 1
  Filled 2015-11-18: qty 1

## 2015-11-18 MED ORDER — METFORMIN HCL 500 MG PO TABS: 500.0000 mg | ORAL_TABLET | Freq: Every day | ORAL | 0 refills | Status: DC

## 2015-11-18 MED ORDER — INSULIN ASPART 100 UNIT/ML ~~LOC~~ SOLN: 6.0000 [IU] | Freq: Three times a day (TID) | SUBCUTANEOUS | 11 refills | Status: DC

## 2015-11-18 MED ORDER — LISINOPRIL 10 MG PO TABS: 10.0000 mg | ORAL_TABLET | Freq: Every day | ORAL | 0 refills | Status: DC

## 2015-11-18 MED FILL — LISINOPRIL 10 MG TABLET: 10 | 30 days supply | Qty: 30 | Fill #0

## 2015-11-18 MED FILL — LANTUS 100 UNITS/ML VIAL: 100 | 18 days supply | Qty: 10 | Fill #0

## 2015-11-18 MED FILL — NovoLOG 100 UNIT/ML SOLN: 100 | 34 days supply | Qty: 10 | Fill #0

## 2015-11-18 MED FILL — metFORMIN HCL 500 MG TABS: 500 | 30 days supply | Qty: 30 | Fill #0

## 2015-11-18 NOTE — Progress Notes (Signed)
[id number]/Rhonda Bran,BSN,RN3,CCM:[phone]: Match program performed and given to patient to have prescriptions filled at the outpt pharmacy center/information for patient to call the [facility] on Friday [phone] given to patient with written and verbal instructions/patient did give a return understanding of these instructions.

## 2015-11-18 NOTE — Discharge Summary (Signed)
Physician Discharge Summary  Frank DollarDanneil Duncan ZOX:[id number] DOB: [birth year] DOA: 11/13/2015  PCP: No PCP Per Patient  Admit date: 11/13/2015 Discharge date: 11/18/2015  Admitted From: Home Disposition: Home   Recommendations for Outpatient Follow-up:  1. To establish care with PCP at [facility] within 1-2 weeks 2. Please insure ability to obtain insulin, lisinopril, and metformin all started during admission 3. LDL 203, triglycerides 211, HDL 43, total cholesterol [id number]. Consider statin 4. Pt is to bring recording of blood glucose to visit, will require insulin titration.  5. Monitor BP 6. Consider increasing dose of metformin as tolerated  Home Health: Outpatient diabetes coordinator was contacted Equipment/Devices: None Discharge Condition: Stable CODE STATUS: Full Diet recommendation: Carbohydrate-limited  Brief/Interim Summary: Tadan Davisis a 35 y.o.malewith no known past medical history presenting on 10/5 with abdominal pain and emesis. This was preceded by a month of weakness, polyuria and polydipsia and worsened last week following binge drinking at a party. On arrival, glucose was 1000, pH on VBG 7.32, gap 15, bicarb 24, Cr 1.09. CBC unremarkable. UA uninfected with glucosuria and ketonuria. IV fluids and insulin infusion were started with improvement. Overnight, glucose was brought down under 250mg /dl, lantus and SSI were started. Basal lantus and bolus novolog mealtime + CBG correction TID and HS coverage sliding scale were started and titrated upward with uncontrolled hyperglycemia. Ultimate doses are as stated in discharge instructions below. Low dose metformin was started. Lisinopril 5mg  then 10mg  was started. These were continued at discharge.  Discharge Diagnoses:  Principal Problem:   Type 2 diabetes mellitus with hyperosmolar nonketotic hyperglycemia (HCC) Active Problems:   Tobacco use disorder   Polysubstance abuse   New onset diabetes  mellitus with hyperosmolar, hyperglycemic, non-ketotic state: Unknown duration of hyperglycemia due to no medical follow up. Hb A1c pending, but anticipate this will be high given insulin requirement. Requiring significant insulin without adequate control - Lantus dose increased to 55u qHS with improvement of hyperglycemia, latest CBG at lunch was 174.  - Continue mealtime and HS coverage - Diabetes coordinator providing education. Pt self-administering insulin. - Added low dose lisinopril, titrated to 10mg  dose.  - CM consult to assist patient with obtaining a PCP, and assistance affording insulin at discharge.  - Outpatient diabetes education consulted - Defer statin inception to PCP.  Tobacco dependence: 1ppd currently, ~20 pack-years. He is currently contemplative. Chronic cough. - Nicotine patch ordered, may require 21mg  dose. - Brief cessation counseling provided, will need follow up. - CXR neg. - Duoneb prn.   Polysubstance abuse: Describes regular binge drinking requiring several days of recovery and history of cocaine use. UDS +THC only. - Counseled about importance of limiting alcohol intake for general health and in light of new Dx diabetes. Recommend he seek help if he is unable to quit on his own.  - Recommended cessation of all illicit substances.   Discharge Instructions Discharge Instructions    Diet Carb Modified    Complete by:  As directed    Discharge instructions    Complete by:  As directed    You have new, severe diabetes that requires insulin for treatment. A couple other medications were added as well.  - START taking metformin 500mg  (1 tablet) every morning. This makes your body more responsive to the insulin.   - START taking lisinopril 10mg  daily. This protects your kidneys from the effects of diabetes.  - START taking lantus 55 units (long acting) every evening at about the same time.  -  START taking novolog (short acting) with meals. The dose will vary  depending on what your blood sugar is before the meal as follows. Take 6 units of novolog with each meal PLUS however many units this table indicates. CBG is capillary blood glucose (blood sugar).  CBG < 120: 0 units CBG 121 - 150: 3 units CBG 151 - 200: 4 units CBG 201 - 250: 7 units CBG 251 - 300: 11 units CBG 301 - 350: 15 units CBG 351 - 400: 20 units  - START taking novolog at night as well. You will measure and record your blood sugar before you go to sleep.  Bedtime correction coverage: CBG < 70: implement hypoglycemia protocol CBG 70 - 120: 0 units CBG 121 - 150: 0 units CBG 151 - 200: 0 units CBG 201 - 250: 2 units CBG 251 - 300: 3 units CBG 301 - 350: 4 units CBG 351 - 400: 5 units Example: You check your blood sugar before lunch, it is 175mg /dl. You administer 10 units of novolog (6 units because you are eating a meal and 4 units because your blood sugar is in the range 151 - 200).   Make sure you understand ALL of these directions before you are discharged.  It is VERY important to follow up at [facility] as soon as possible following discharge. Take your recordings of all blood sugars to that visit to help the doctor know how to change your insulin.   You should also consider cutting down and ultimately STOPPING cigarettes.       Medication List    TAKE these medications   docusate sodium 100 MG capsule Commonly known as:  COLACE Take 1 capsule (100 mg total) by mouth every 12 (twelve) hours.   insulin aspart 100 UNIT/ML injection Commonly known as:  novoLOG Inject 6 Units into the skin 3 (three) times daily with meals. And add 0-20 units per mealtime sliding scale with meals. And take 0-5 units per nighttime sliding scale at the hour of sleep   insulin glargine 100 UNIT/ML injection Commonly known as:  LANTUS Inject 0.55 mLs (55 Units total) into the skin at bedtime.   lisinopril 10 MG tablet Commonly known as:  PRINIVIL,ZESTRIL Take 1 tablet  (10 mg total) by mouth daily. Start taking on:  11/19/2015   metFORMIN 500 MG tablet Commonly known as:  GLUCOPHAGE Take 1 tablet (500 mg total) by mouth daily with breakfast. Start taking on:  11/19/2015   polyethylene glycol powder powder Commonly known as:  MIRALAX Take one capful by mouth 2 times a day until stools are loose      Follow-up Information    [facility]. Schedule an appointment as soon as possible for a visit today.   Contact information: [address] [place] [address] [phone]         No Known Allergies  Consultations:  Diabetes educator  Procedures/Studies: Dg Chest 2 View  Result Date: 11/15/2015 CLINICAL DATA:  Cough and burning sensation in chest EXAM: CHEST  2 VIEW COMPARISON:  None. FINDINGS: Lungs are clear. Heart size and pulmonary vascularity are normal. No adenopathy. There is thoracic lordosis. No pneumothorax. IMPRESSION: No edema or consolidation. Electronically Signed   By: Bretta Bang III M.D.   On: 11/15/2015 15:41      Subjective: Pt feeling well this AM, been administering insulin and has received diabetes education.   Discharge Exam: Vitals:   11/18/15  9211 11/18/15 1004  BP: 107/71 122/64  Pulse: 68   Resp: 19   Temp: 98.2 F (36.8 C)    Vitals:   11/17/15 1349 11/17/15 2205 11/18/15 0626 11/18/15 1004  BP: 119/61 124/74 107/71 122/64  Pulse: 62 67 68   Resp: 20 19 19    Temp: 98.4 F (36.9 C) 97.6 F (36.4 C) 98.2 F (36.8 C)   TempSrc: Oral Oral Oral   SpO2: 99% 99% 100%   Weight:      Height:       General: Pt is alert, awake, not in acute distress Cardiovascular: RRR, S1/S2 +, no rubs, no gallops Respiratory: CTA bilaterally, no wheezing, no rhonchi Abdominal: Soft, obese, NT, ND, bowel sounds + Extremities: no edema, no cyanosis  The results of significant diagnostics from this hospitalization (including imaging, microbiology, ancillary and  laboratory) are listed below for reference.    Microbiology: MRSA swab neg  Labs: Basic Metabolic Panel:  Recent Labs Lab 11/14/15 0322 11/15/15 0639 11/16/15 0621 11/17/15 0511 11/18/15 0529  NA 134* 133* 136 135 136  K 3.3* 3.9 3.7 3.8 4.2  CL 98* 100* 102 100* 103  CO2 26 26 26 26 28   GLUCOSE 151* 325* 301* 241* 326*  BUN 12 11 9 12 11   CREATININE 0.72 0.88 0.84 0.90 0.83  CALCIUM 9.2 8.4* 9.0 8.9 8.9   Liver Function Tests:  Recent Labs Lab 11/13/15 1715  AST 15  ALT 20  ALKPHOS 94  BILITOT 1.3*  PROT 7.0  ALBUMIN 4.0    Recent Labs Lab 11/13/15 1715  LIPASE 38   CBC:  Recent Labs Lab 11/13/15 1715 11/14/15 0322  WBC 4.1 5.2  NEUTROABS 2.3  --   HGB 16.8 15.9  HCT 45.8 42.7  MCV 84.0 83.9  PLT 226 244   CBG:  Recent Labs Lab 11/17/15 1147 11/17/15 1710 11/17/15 2143 11/18/15 0732 11/18/15 1142  GLUCAP 266* 205* 165* 271* 174*   Lipid Profile  Recent Labs  11/18/15 0529  CHOL 288*  HDL 43  LDLCALC 203*  TRIG 211*  CHOLHDL 6.7   Urinalysis    Component Value Date/Time   COLORURINE YELLOW 11/13/2015 1714   APPEARANCEUR CLEAR 11/13/2015 1714   LABSPEC 1.031 (H) 11/13/2015 1714   PHURINE 5.0 11/13/2015 1714   GLUCOSEU >1000 (A) 11/13/2015 1714   HGBUR NEGATIVE 11/13/2015 1714   BILIRUBINUR NEGATIVE 11/13/2015 1714   KETONESUR 15 (A) 11/13/2015 1714   PROTEINUR NEGATIVE 11/13/2015 1714   UROBILINOGEN 1.0 12/10/2014 0001   NITRITE NEGATIVE 11/13/2015 1714   LEUKOCYTESUR NEGATIVE 11/13/2015 1714   Time coordinating discharge: Over 30 minutes  Hazeline Junker, MD  [facility] 11/18/2015, 12:43 PM Pager [phone]  If 7PM-7AM, please contact night-coverage [email or web address] Password [email or web address]

## 2016-03-26 ENCOUNTER — Encounter: Payer: Self-pay | Admitting: Family Medicine

## 2016-03-26 ENCOUNTER — Ambulatory Visit: Payer: BLUE CROSS/BLUE SHIELD | Attending: Family Medicine | Admitting: Family Medicine

## 2016-03-26 ENCOUNTER — Other Ambulatory Visit: Payer: Self-pay | Admitting: Family Medicine

## 2016-03-26 VITALS — BP 91/57 | HR 79 | Temp 98.6°F | Resp 18 | Ht 69.0 in | Wt 227.6 lb

## 2016-03-26 DIAGNOSIS — I10 Essential (primary) hypertension: Secondary | ICD-10-CM | POA: Diagnosis present

## 2016-03-26 DIAGNOSIS — E118 Type 2 diabetes mellitus with unspecified complications: Secondary | ICD-10-CM | POA: Diagnosis present

## 2016-03-26 DIAGNOSIS — E1100 Type 2 diabetes mellitus with hyperosmolarity without nonketotic hyperglycemic-hyperosmolar coma (NKHHC): Secondary | ICD-10-CM | POA: Diagnosis not present

## 2016-03-26 DIAGNOSIS — E1101 Type 2 diabetes mellitus with hyperosmolarity with coma: Secondary | ICD-10-CM | POA: Diagnosis not present

## 2016-03-26 DIAGNOSIS — Z794 Long term (current) use of insulin: Secondary | ICD-10-CM | POA: Diagnosis not present

## 2016-03-26 DIAGNOSIS — F17200 Nicotine dependence, unspecified, uncomplicated: Secondary | ICD-10-CM

## 2016-03-26 LAB — BASIC METABOLIC PANEL WITH GFR
BUN: 12 mg/dL (ref 7–25)
CO2: 28 mmol/L (ref 20–31)
CREATININE: 1 mg/dL (ref 0.60–1.35)
Calcium: 9.9 mg/dL (ref 8.6–10.3)
Chloride: 102 mmol/L (ref 98–110)
GFR, Est African American: >89 mL/min (ref >=60)
GFR, Est Non African American: >89 mL/min (ref >=60)
GLUCOSE: 124 mg/dL — AB (ref 65–99)
Potassium: 4.2 mmol/L (ref 3.5–5.3)
Sodium: 138 mmol/L (ref 135–146)

## 2016-03-26 LAB — LIPID PANEL
CHOL/HDL RATIO: 3.1 ratio (ref <5.0)
Cholesterol: 167 mg/dL (ref <200)
HDL: 54 mg/dL (ref >40)
LDL CALC: 91 mg/dL (ref <100)
Triglycerides: 108 mg/dL (ref <150)
VLDL: 22 mg/dL (ref <30)

## 2016-03-26 LAB — GLUCOSE, POCT (MANUAL RESULT ENTRY): POC Glucose: 166 mg/dl — AB (ref 70–99)

## 2016-03-26 LAB — POCT GLYCOSYLATED HEMOGLOBIN (HGB A1C): HEMOGLOBIN A1C: 5.8

## 2016-03-26 MED ORDER — LISINOPRIL 10 MG PO TABS: 10.0000 mg | ORAL_TABLET | Freq: Every day | ORAL | 2 refills | Status: DC

## 2016-03-26 MED ORDER — NICOTINE 21 MG/24HR TD PT24: 21.0000 mg | MEDICATED_PATCH | Freq: Every day | TRANSDERMAL | 1 refills | Status: AC

## 2016-03-26 MED ORDER — INSULIN ASPART 100 UNIT/ML FLEXPEN: 6.0000 [IU] | PEN_INJECTOR | Freq: Three times a day (TID) | SUBCUTANEOUS | 11 refills | Status: AC

## 2016-03-26 MED ORDER — HYDROCHLOROTHIAZIDE 12.5 MG PO TABS
25.0000 mg | ORAL_TABLET | Freq: Every day | ORAL | 2 refills | Status: AC
Start: 2016-03-26 — End: ?

## 2016-03-26 MED ORDER — INSULIN GLARGINE 100 UNIT/ML SOLOSTAR PEN: 55.0000 [IU] | PEN_INJECTOR | Freq: Every day | SUBCUTANEOUS | 11 refills | Status: AC

## 2016-03-26 NOTE — Patient Instructions (Signed)
Come back 2 week for BP check   Hypertension Hypertension is another name for high blood pressure. High blood pressure forces your heart to work harder to pump blood. A blood pressure reading has two numbers, which includes a higher number over a lower number (example: 110/72). Follow these instructions at home:  Have your blood pressure rechecked by your doctor.  Only take medicine as told by your doctor. Follow the directions carefully. The medicine does not work as well if you skip doses. Skipping doses also puts you at risk for problems.  Do not smoke.  Monitor your blood pressure at home as told by your doctor. Contact a doctor if:  You think you are having a reaction to the medicine you are taking.  You have repeat headaches or feel dizzy.  You have puffiness (swelling) in your ankles.  You have trouble with your vision. Get help right away if:  You get a very bad headache and are confused.  You feel weak, numb, or faint.  You get chest or belly (abdominal) pain.  You throw up (vomit).  You cannot breathe very well. This information is not intended to replace advice given to you by your health care provider. Make sure you discuss any questions you have with your health care provider. Document Released: 07/14/2007 Document Revised: 07/03/2015 Document Reviewed: 11/17/2012 Elsevier Interactive Patient Education  2017 [employer]

## 2016-03-26 NOTE — Progress Notes (Signed)
Subjective:  Patient ID: Frank Duncan, male    DOB: 12-Sep-1980  Age: 36 y.o. MRN: [id number]  CC: Establish Care   HPI Frank Duncan presents To establish care for hypertension and diabetes. He denies regularly checking his blood pressures or blood sugars at home.He denies any blurred vision, paresthesias, or skin wounds. Reports running out of his anti-hypertensive medications and used his mother's antihypertensive medication today. He cannot recall medication dosage. He denies any chest pain, shortness of breath, or swelling of the bilateral lower extremities. He is currently using insulin syringes for injection and is requesting to be changed to insulin pens.   Outpatient Medications Prior to Visit  Medication Sig Dispense Refill  . insulin aspart (NOVOLOG) 100 UNIT/ML injection Inject 6 Units into the skin 3 (three) times daily with meals. And add 0-20 units per mealtime sliding scale with meals. And take 0-5 units per nighttime sliding scale at the hour of sleep 10 mL 11  . insulin glargine (LANTUS) 100 UNIT/ML injection Inject 0.55 mLs (55 Units total) into the skin at bedtime. 10 mL 11  . lisinopril (PRINIVIL,ZESTRIL) 10 MG tablet Take 1 tablet (10 mg total) by mouth daily. 30 tablet 0  . metFORMIN (GLUCOPHAGE) 500 MG tablet Take 1 tablet (500 mg total) by mouth daily with breakfast. 30 tablet 0  . docusate sodium (COLACE) 100 MG capsule Take 1 capsule (100 mg total) by mouth every 12 (twelve) hours. (Patient not taking: Reported on 11/13/2015) 60 capsule 0  . polyethylene glycol powder (MIRALAX) powder Take one capful by mouth 2 times a day until stools are loose (Patient not taking: Reported on 11/13/2015) 255 g 0   No facility-administered medications prior to visit.     ROS Review of Systems  Eyes: Negative.   Respiratory: Negative.   Cardiovascular: Negative.   Gastrointestinal: Negative.   Neurological: Negative.     Objective:  BP (!) 91/57 (BP Location: Left Arm,  Patient Position: Sitting, Cuff Size: Normal)   Pulse 79   Temp 98.6 F (37 C) (Oral)   Resp 18   Ht 5\' 9"  (1.753 m)   Wt 227 lb 9.6 oz (103.2 kg)   SpO2 95%   BMI 33.61 kg/m   BP/Weight 03/26/2016 11/18/2015 11/13/2015  Systolic BP 91 122 -  Diastolic BP 57 64 -  Wt. (Lbs) 227.6 - 242.51  BMI 33.61 - 35.81    Physical Exam  Constitutional: He is oriented to person, place, and time.  Eyes: Conjunctivae are normal. Pupils are equal, round, and reactive to light.  Neck: No JVD present.  Cardiovascular: Normal rate, regular rhythm, normal heart sounds and intact distal pulses.   Pulmonary/Chest: Effort normal and breath sounds normal.  Abdominal: Soft. Bowel sounds are normal.  Neurological: He is alert and oriented to person, place, and time.  Skin: Skin is warm and dry.  Nursing note and vitals reviewed.   Assessment & Plan:   Problem List Items Addressed This Visit      Endocrine   Type 2 diabetes mellitus with hyperosmolar nonketotic hyperglycemia (HCC) - Primary   Relevant Medications   lisinopril (PRINIVIL,ZESTRIL) 10 MG tablet   insulin aspart (NOVOLOG) 100 UNIT/ML FlexPen   Insulin Glargine (LANTUS) 100 UNIT/ML Solostar Pen   Other Relevant Orders   Glucose (CBG) (Completed)   HgB A1c (Completed)   BASIC METABOLIC PANEL WITH GFR (Completed)   Microalbumin/Creatinine Ratio, Urine (Completed)   Lipid Panel (Completed)    Other Visit Diagnoses  Essential hypertension       Relevant Medications   lisinopril (PRINIVIL,ZESTRIL) 10 MG tablet   hydrochlorothiazide (HYDRODIURIL) 12.5 MG tablet      Meds ordered this encounter  Medications  . lisinopril (PRINIVIL,ZESTRIL) 10 MG tablet    Sig: Take 1 tablet (10 mg total) by mouth daily.    Dispense:  30 tablet    Refill:  2    Order Specific Question:   Supervising Provider    Answer:   Quentin AngstJEGEDE, OLUGBEMIGA E [id number]]  . hydrochlorothiazide (HYDRODIURIL) 12.5 MG tablet    Sig: Take 2 tablets (25 mg total) by  mouth daily.    Dispense:  30 tablet    Refill:  2    Order Specific Question:   Supervising Provider    Answer:   Quentin AngstJEGEDE, OLUGBEMIGA E [id number]]  . insulin aspart (NOVOLOG) 100 UNIT/ML FlexPen    Sig: Inject 6 Units into the skin 3 (three) times daily with meals.    Dispense:  15 mL    Refill:  11    Order Specific Question:   Supervising Provider    Answer:   Quentin AngstJEGEDE, OLUGBEMIGA E [id number]]  . Insulin Glargine (LANTUS) 100 UNIT/ML Solostar Pen    Sig: Inject 55 Units into the skin at bedtime.    Dispense:  15 mL    Refill:  11    Order Specific Question:   Supervising Provider    Answer:   Quentin AngstJEGEDE, OLUGBEMIGA E [[id number]]    Follow-up: Return in about 2 weeks (around 04/09/2016) for BP check nurse & Follow up in 3 months for DM/ HTN.   Lizbeth BarkMandesia R Romulus Hanrahan FNP

## 2016-03-27 LAB — MICROALBUMIN / CREATININE URINE RATIO
Creatinine, Urine: 290 mg/dL (ref 20–370)
Microalb Creat Ratio: 3 mcg/mg creat (ref <30)
Microalb, Ur: 1 mg/dL

## 2016-03-31 ENCOUNTER — Telehealth: Payer: Self-pay | Admitting: Family Medicine

## 2016-03-31 ENCOUNTER — Other Ambulatory Visit: Payer: Self-pay | Admitting: Family Medicine

## 2016-03-31 DIAGNOSIS — Z794 Long term (current) use of insulin: Principal | ICD-10-CM

## 2016-03-31 DIAGNOSIS — E119 Type 2 diabetes mellitus without complications: Secondary | ICD-10-CM | POA: Insufficient documentation

## 2016-03-31 MED ORDER — METFORMIN HCL 500 MG PO TABS: 500.0000 mg | ORAL_TABLET | Freq: Every day | ORAL | 2 refills | Status: DC

## 2016-03-31 NOTE — Telephone Encounter (Signed)
Patient called the office to speak with PCP regarding his medication metFORMIN (GLUCOPHAGE) 500 MG tablet. Pt stated that on his office visit he informed PCP that he needed refills for metFORMIN (GLUCOPHAGE) 500 MG tablet but wasn't called in to [facility] on [address] as his other medications were. Please follow up.  Thank you.

## 2016-03-31 NOTE — Telephone Encounter (Signed)
Patient called the office to speak with PCP regarding his medication metFORMIN (GLUCOPHAGE) 500 MG tablet. Pt stated that on his office visit he informed PCP that he needed refills for metFORMIN (GLUCOPHAGE) 500 MG tablet but wasn't called in to [facility] on [address] as his other medications were. Please follow up.

## 2016-03-31 NOTE — Telephone Encounter (Signed)
Called and addressed patient's request. He verbalizes understanding.

## 2016-04-01 ENCOUNTER — Other Ambulatory Visit: Payer: Self-pay | Admitting: Family Medicine

## 2016-04-01 DIAGNOSIS — Z794 Long term (current) use of insulin: Principal | ICD-10-CM

## 2016-04-01 DIAGNOSIS — E119 Type 2 diabetes mellitus without complications: Secondary | ICD-10-CM

## 2016-04-01 NOTE — Telephone Encounter (Signed)
-----   Message from Lizbeth BarkMandesia R Hairston, [place]NP sent at 04/01/2016  2:11 PM EST ----- Microalbumin/creatinine ratio level was normal. This tests for protein in your urine that can indicate early signs of kidney damage.  Kidney function normal Cholesterol levels are normal. Diabetes can increase your risk of high cholesterol Limit your intake of fried foods, red meats, and whole milk.  HgbA1c is 5.8 which is at goal. Goal for diabetics is HgbA1c levels  7 or below. Continue taking your diabetic medications. Recheck in 3 months.

## 2016-04-01 NOTE — Telephone Encounter (Signed)
CMA call to go over lab results  Patient did not answer CMA left a VM stating the reason of the call & to call me back

## 2016-04-06 NOTE — Telephone Encounter (Signed)
Patient return CMA call  CMA inform about lab results  Patient was aware and understood

## 2016-11-06 IMAGING — CR DG ABDOMEN 1V
1 series · 1 of 1 positions shown · non-contrast
Comparison: None.

CLINICAL DATA: Abdominal pain for 1 week

EXAM:
ABDOMEN - 1 VIEW

[t abdomen supine]
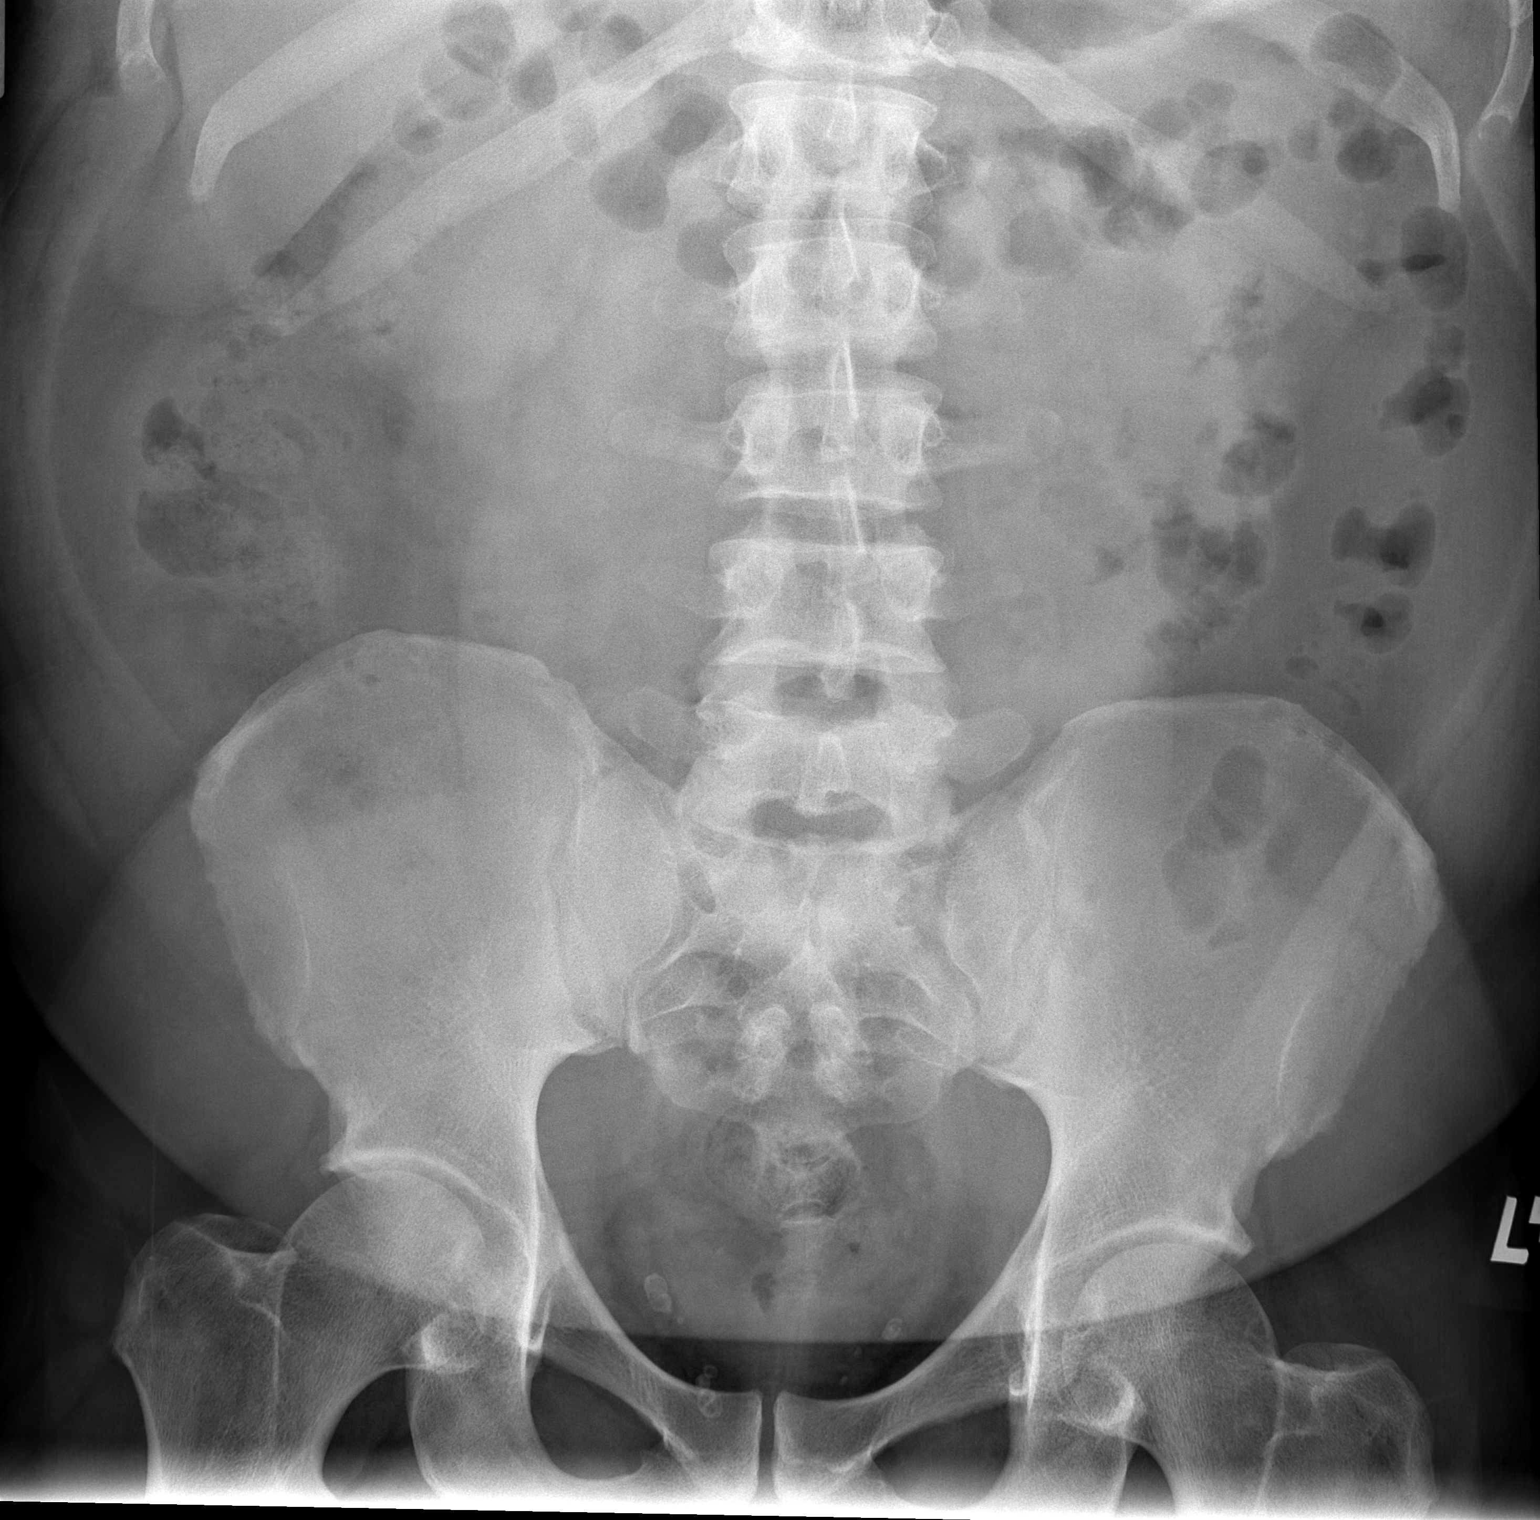

[1 of 1 positions shown; findings below may reference images not displayed]

FINDINGS: Scattered large and small bowel gas is seen. No obstructive changes
are noted. Mild fecal material is noted within the right colon. No
acute bony abnormality is seen.
IMPRESSION: No acute abnormality noted.

## 2020-09-08 ENCOUNTER — Encounter (HOSPITAL_BASED_OUTPATIENT_CLINIC_OR_DEPARTMENT_OTHER): Payer: Self-pay

## 2020-09-08 ENCOUNTER — Other Ambulatory Visit: Payer: Self-pay

## 2020-09-08 ENCOUNTER — Emergency Department (HOSPITAL_BASED_OUTPATIENT_CLINIC_OR_DEPARTMENT_OTHER)
Admission: EM | Admit: 2020-09-08 | Discharge: 2020-09-08 | Disposition: A | Discharge status: Home / Self Care | Payer: BLUE CROSS/BLUE SHIELD | Attending: Emergency Medicine | Admitting: Emergency Medicine

## 2020-09-08 DIAGNOSIS — H6690 Otitis media, unspecified, unspecified ear: Secondary | ICD-10-CM

## 2020-09-08 DIAGNOSIS — H60502 Unspecified acute noninfective otitis externa, left ear: Secondary | ICD-10-CM | POA: Insufficient documentation

## 2020-09-08 DIAGNOSIS — Z7984 Long term (current) use of oral hypoglycemic drugs: Secondary | ICD-10-CM | POA: Insufficient documentation

## 2020-09-08 DIAGNOSIS — Z794 Long term (current) use of insulin: Secondary | ICD-10-CM | POA: Insufficient documentation

## 2020-09-08 DIAGNOSIS — F17210 Nicotine dependence, cigarettes, uncomplicated: Secondary | ICD-10-CM | POA: Insufficient documentation

## 2020-09-08 DIAGNOSIS — H6092 Unspecified otitis externa, left ear: Secondary | ICD-10-CM | POA: Insufficient documentation

## 2020-09-08 DIAGNOSIS — E119 Type 2 diabetes mellitus without complications: Secondary | ICD-10-CM | POA: Insufficient documentation

## 2020-09-08 MED ORDER — NEOMYCIN-POLYMYXIN-HC 3.5-10000-1 OT SUSP: 4.0000 [drp] | Freq: Three times a day (TID) | OTIC | 0 refills | Status: AC

## 2020-09-08 MED ORDER — AZITHROMYCIN 250 MG PO TABS: 250.0000 mg | ORAL_TABLET | Freq: Every day | ORAL | 0 refills | Status: AC

## 2020-09-08 NOTE — ED Provider Notes (Signed)
[facility] EMERGENCY DEPARTMENT Provider Note   CSN: [id number] Arrival date & time: 09/08/20  1926     History Chief Complaint  Patient presents with   Ear Fullness    Frank Duncan is a 40 y.o. male.  Patient with a complaint of left ear pain which has been improving its been ongoing for about a week.  He had pain all around the earlobe area itself and then had some drainage inside.  Patient without any chest pain or shortness of breath.  Patient stated he had a little bit of drainage into the throat.  Chart review shows no history of any kind of esophageal tear even though penchant patient made some mention of that.  Past medical history sniffing for diabetes smoker polysubstance abuse in the past.      Past Medical History:  Diagnosis Date   DM (diabetes mellitus) (HCC)    Type II   Smoker    per patient attempting to quit    Patient Active Problem List   Diagnosis Date Noted   DM (diabetes mellitus) (HCC) 03/31/2016   Type 2 diabetes mellitus with hyperosmolar nonketotic hyperglycemia (HCC) 11/13/2015   Tobacco use disorder 11/13/2015   Polysubstance abuse (HCC) 11/13/2015    History reviewed. No pertinent surgical history.     Family History  Problem Relation Age of Onset   Hypertension Mother    Diabetes Maternal Grandmother     Social History   Tobacco Use   Smoking status: Every Day    Packs/day: 1.00    Years: 20.00    Pack years: 20.00    Types: Cigarettes   Smokeless tobacco: Never  Substance Use Topics   Alcohol use: Yes    Comment: daily   Drug use: Yes    Types: Marijuana    Home Medications Prior to Admission medications   Medication Sig Start Date End Date Taking? Authorizing Provider  azithromycin (ZITHROMAX) 250 MG tablet Take 1 tablet (250 mg total) by mouth daily. Take first 2 tablets together, then 1 every day until finished. 09/08/20  Yes Vanetta Mulders, MD  neomycin-polymyxin-hydrocortisone (CORTISPORIN) 3.5-10000-1  OTIC suspension Place 4 drops into the left ear 3 (three) times daily. 09/08/20  Yes Vanetta Mulders, MD  docusate sodium (COLACE) 100 MG capsule Take 1 capsule (100 mg total) by mouth every 12 (twelve) hours. Patient not taking: Reported on 11/13/2015 12/10/14   Horton, Mayer Masker, MD  hydrochlorothiazide (HYDRODIURIL) 12.5 MG tablet Take 2 tablets (25 mg total) by mouth daily. 03/26/16   Lizbeth Bark, FNP  insulin aspart (NOVOLOG) 100 UNIT/ML FlexPen Inject 6 Units into the skin 3 (three) times daily with meals. 03/26/16   Lizbeth Bark, FNP  Insulin Glargine (LANTUS) 100 UNIT/ML Solostar Pen Inject 55 Units into the skin at bedtime. 03/26/16   Lizbeth Bark, FNP  lisinopril (PRINIVIL,ZESTRIL) 10 MG tablet Take 1 tablet (10 mg total) by mouth daily. 03/26/16   Lizbeth Bark, FNP  metFORMIN (GLUCOPHAGE) 500 MG tablet Take 1 tablet (500 mg total) by mouth daily with breakfast. 03/31/16   Lizbeth Bark, FNP  nicotine (NICODERM CQ - DOSED IN MG/24 HOURS) 21 mg/24hr patch Place 1 patch (21 mg total) onto the skin daily. 03/26/16   Lizbeth Bark, FNP  polyethylene glycol powder (MIRALAX) powder Take one capful by mouth 2 times a day until stools are loose Patient not taking: Reported on 11/13/2015 12/10/14   Horton, Mayer Masker, MD    Allergies  Patient has no known allergies.  Review of Systems   Review of Systems  Constitutional:  Negative for chills and fever.  HENT:  Positive for ear pain. Negative for sore throat.   Eyes:  Negative for pain and visual disturbance.  Respiratory:  Negative for cough and shortness of breath.   Cardiovascular:  Negative for chest pain and palpitations.  Gastrointestinal:  Negative for abdominal pain and vomiting.  Genitourinary:  Negative for dysuria and hematuria.  Musculoskeletal:  Negative for arthralgias and back pain.  Skin:  Negative for color change and rash.  Neurological:  Negative for seizures and syncope.  All  other systems reviewed and are negative.  Physical Exam Updated Vital Signs BP (!) 134/99 (BP Location: Left Arm)   Pulse (!) 59   Temp 98.4 F (36.9 C) (Oral)   Resp 20   Ht 1.753 m (5\' 9" )   Wt 112 kg   SpO2 99%   BMI 36.48 kg/m   Physical Exam Vitals and nursing note reviewed.  Constitutional:      General: He is not in acute distress.    Appearance: Normal appearance. He is well-developed. He is not ill-appearing.  HENT:     Head: Normocephalic and atraumatic.     Right Ear: Tympanic membrane, ear canal and external ear normal.     Left Ear: Ear canal normal.     Ears:     Comments: Left pinna with some swelling and a little bit of pain to movement.  Patient's canal with a little bit of fluid.  Not able to completely see the tympanic membrane but what can be seen at does appear erythematous on that side.  No bloody discharge. Eyes:     Extraocular Movements: Extraocular movements intact.     Conjunctiva/sclera: Conjunctivae normal.     Pupils: Pupils are equal, round, and reactive to light.  Cardiovascular:     Rate and Rhythm: Normal rate and regular rhythm.     Heart sounds: No murmur heard. Pulmonary:     Effort: Pulmonary effort is normal. No respiratory distress.     Breath sounds: Normal breath sounds.  Abdominal:     Palpations: Abdomen is soft.     Tenderness: There is no abdominal tenderness.  Musculoskeletal:     Cervical back: Normal range of motion and neck supple. No rigidity.  Lymphadenopathy:     Cervical: No cervical adenopathy.  Skin:    General: Skin is warm and dry.  Neurological:     General: No focal deficit present.     Mental Status: He is alert and oriented to person, place, and time.    ED Results / Procedures / Treatments   Labs (all labs ordered are listed, but only abnormal results are displayed) Labs Reviewed - No data to display  EKG None  Radiology No results found.  Procedures Procedures   Medications Ordered in  ED Medications - No data to display  ED Course  I have reviewed the triage vital signs and the nursing notes.  Pertinent labs & imaging results that were available during my care of the patient were reviewed by me and considered in my medical decision making (see chart for details).    MDM Rules/Calculators/A&P                           Patient with possible with left otitis media and left otitis externa.  Will treat both ways.  Not  able to completely visualize the left tympanic membrane.  We will have him follow-up with ear nose and throat for further assessment make sure nothing like a cholesteatoma is is being missed.  Patient nontoxic no acute distress.   Final Clinical Impression(s) / ED Diagnoses Final diagnoses:  Acute otitis media, unspecified otitis media type  Acute otitis externa of left ear, unspecified type    Rx / DC Orders ED Discharge Orders          Ordered    azithromycin (ZITHROMAX) 250 MG tablet  Daily        09/08/20 2230    neomycin-polymyxin-hydrocortisone (CORTISPORIN) 3.5-10000-1 OTIC suspension  3 times daily        09/08/20 2230             Vanetta Mulders, MD 09/08/20 2236

## 2020-09-08 NOTE — ED Triage Notes (Addendum)
Pt c/o ear fullness, drainage, pain to left side of neck x 1 week-states feels similar to when he had a tear in his esophagus ~3 years ago-states he did not have surgery r/t tear-NAD-steady gait

## 2020-09-08 NOTE — ED Notes (Signed)
Printed pt goodrx coupons for medications.  No questions, ambulatory out of the dept.

## 2020-09-08 NOTE — Discharge Instructions (Addendum)
Take the antibiotic as directed.  Use eardrops as directed.  Return for any new or worse symptoms.  Make an appointment to follow-up with ear nose and throat to get a good look at that eardrum to make sure things are healing well.

## 2022-03-30 ENCOUNTER — Emergency Department (HOSPITAL_BASED_OUTPATIENT_CLINIC_OR_DEPARTMENT_OTHER)
Admission: EM | Admit: 2022-03-30 | Discharge: 2022-03-30 | Disposition: A | Discharge status: Home / Self Care | Payer: BLUE CROSS/BLUE SHIELD | Attending: Emergency Medicine | Admitting: Emergency Medicine

## 2022-03-30 ENCOUNTER — Encounter (HOSPITAL_BASED_OUTPATIENT_CLINIC_OR_DEPARTMENT_OTHER): Payer: Self-pay

## 2022-03-30 ENCOUNTER — Other Ambulatory Visit: Payer: Self-pay

## 2022-03-30 DIAGNOSIS — L089 Local infection of the skin and subcutaneous tissue, unspecified: Secondary | ICD-10-CM | POA: Diagnosis not present

## 2022-03-30 DIAGNOSIS — Z794 Long term (current) use of insulin: Secondary | ICD-10-CM | POA: Insufficient documentation

## 2022-03-30 DIAGNOSIS — Z79899 Other long term (current) drug therapy: Secondary | ICD-10-CM | POA: Diagnosis not present

## 2022-03-30 DIAGNOSIS — E1165 Type 2 diabetes mellitus with hyperglycemia: Secondary | ICD-10-CM | POA: Diagnosis not present

## 2022-03-30 DIAGNOSIS — E119 Type 2 diabetes mellitus without complications: Secondary | ICD-10-CM

## 2022-03-30 DIAGNOSIS — R739 Hyperglycemia, unspecified: Secondary | ICD-10-CM

## 2022-03-30 DIAGNOSIS — I10 Essential (primary) hypertension: Secondary | ICD-10-CM

## 2022-03-30 DIAGNOSIS — Z7984 Long term (current) use of oral hypoglycemic drugs: Secondary | ICD-10-CM | POA: Insufficient documentation

## 2022-03-30 LAB — CBG MONITORING, ED
Glucose-Capillary: 503 mg/dL (ref 70–99)
Glucose-Capillary: 420 mg/dL — ABNORMAL HIGH (ref 70–99)

## 2022-03-30 MED ORDER — DOXYCYCLINE HYCLATE 100 MG PO CAPS: 100.0000 mg | ORAL_CAPSULE | Freq: Two times a day (BID) | ORAL | 0 refills | Status: DC

## 2022-03-30 MED ORDER — INSULIN REGULAR HUMAN 100 UNIT/ML IJ SOLN: 10.0000 [IU] | Freq: Once | INTRAMUSCULAR | Status: DC

## 2022-03-30 MED ORDER — LISINOPRIL 10 MG PO TABS: 10.0000 mg | ORAL_TABLET | Freq: Every day | ORAL | 2 refills | Status: DC

## 2022-03-30 MED ORDER — INSULIN ASPART 100 UNIT/ML IJ SOLN
10.0000 [IU] | Freq: Once | INTRAMUSCULAR | Status: AC
  Administered 2022-03-30: 10 [IU] via SUBCUTANEOUS

## 2022-03-30 MED ORDER — LISINOPRIL 10 MG PO TABS: 10.0000 mg | ORAL_TABLET | Freq: Every day | ORAL | 2 refills | Status: AC

## 2022-03-30 MED ORDER — DOXYCYCLINE HYCLATE 100 MG PO CAPS: 100.0000 mg | ORAL_CAPSULE | Freq: Two times a day (BID) | ORAL | 0 refills | Status: AC

## 2022-03-30 MED ORDER — METFORMIN HCL 500 MG PO TABS: 500.0000 mg | ORAL_TABLET | Freq: Two times a day (BID) | ORAL | 2 refills | Status: AC

## 2022-03-30 MED ORDER — METFORMIN HCL 500 MG PO TABS: 500.0000 mg | ORAL_TABLET | Freq: Two times a day (BID) | ORAL | 2 refills | Status: DC

## 2022-03-30 MED ORDER — LIVING WELL WITH DIABETES BOOK: Freq: Once | Status: AC

## 2022-03-30 MED ORDER — DOXYCYCLINE HYCLATE 100 MG PO TABS
100.0000 mg | ORAL_TABLET | Freq: Once | ORAL | Status: AC
  Administered 2022-03-30: 100 mg via ORAL
  Filled 2022-03-30: qty 1

## 2022-03-30 NOTE — ED Provider Notes (Signed)
[facility] Provider Note   CSN: [id number] Arrival date & time: 03/30/22  1543     History  Chief Complaint  Patient presents with   Abscess    Frank Duncan is a 42 y.o. male.  Patient presents to the emergency department today for evaluation of pustules on the scalp as well as 1 in the right axilla.  Patient has a history of diabetes.  Also hypertension.  He is poorly compliant with his medications.  He has had associated tenderness especially on the scalp.  The 2 pustules on the posterior scalp resolving, 1 on the top of the head has been swelling.  He has had 1 in the axilla which he was able to squeeze and express some purulent material from.       Home Medications Prior to Admission medications   Medication Sig Start Date End Date Taking? Authorizing Provider  azithromycin (ZITHROMAX) 250 MG tablet Take 1 tablet (250 mg total) by mouth daily. Take first 2 tablets together, then 1 every day until finished. 09/08/20   Fredia Sorrow, MD  docusate sodium (COLACE) 100 MG capsule Take 1 capsule (100 mg total) by mouth every 12 (twelve) hours. Patient not taking: Reported on 11/13/2015 12/10/14   Horton, Barbette Hair, MD  hydrochlorothiazide (HYDRODIURIL) 12.5 MG tablet Take 2 tablets (25 mg total) by mouth daily. 03/26/16   Alfonse Spruce, FNP  insulin aspart (NOVOLOG) 100 UNIT/ML FlexPen Inject 6 Units into the skin 3 (three) times daily with meals. 03/26/16   Alfonse Spruce, FNP  Insulin Glargine (LANTUS) 100 UNIT/ML Solostar Pen Inject 55 Units into the skin at bedtime. 03/26/16   Alfonse Spruce, FNP  lisinopril (PRINIVIL,ZESTRIL) 10 MG tablet Take 1 tablet (10 mg total) by mouth daily. 03/26/16   Alfonse Spruce, FNP  metFORMIN (GLUCOPHAGE) 500 MG tablet Take 1 tablet (500 mg total) by mouth daily with breakfast. 03/31/16   Alfonse Spruce, FNP  neomycin-polymyxin-hydrocortisone (CORTISPORIN) 3.5-10000-1 OTIC  suspension Place 4 drops into the left ear 3 (three) times daily. 09/08/20   Fredia Sorrow, MD  nicotine (NICODERM CQ - DOSED IN MG/24 HOURS) 21 mg/24hr patch Place 1 patch (21 mg total) onto the skin daily. 03/26/16   Alfonse Spruce, FNP  polyethylene glycol powder (MIRALAX) powder Take one capful by mouth 2 times a day until stools are loose Patient not taking: Reported on 11/13/2015 12/10/14   Horton, Barbette Hair, MD      Allergies    Patient has no known allergies.    Review of Systems   Review of Systems  Physical Exam Updated Vital Signs BP (!) 177/119 (BP Location: Right Arm)   Pulse 71   Temp 98.8 F (37.1 C) (Oral)   Resp 18   Ht 5' 9"$  (1.753 m)   Wt 113.4 kg   SpO2 98%   BMI 36.92 kg/m   Physical Exam Vitals and nursing note reviewed.  Constitutional:      General: He is not in acute distress.    Appearance: He is well-developed.  HENT:     Head: Normocephalic and atraumatic.     Nose: No congestion.     Mouth/Throat:     Mouth: Mucous membranes are moist.  Eyes:     General:        Right eye: No discharge.        Left eye: No discharge.     Conjunctiva/sclera: Conjunctivae normal.  Cardiovascular:  Rate and Rhythm: Normal rate and regular rhythm.     Heart sounds: Normal heart sounds.  Pulmonary:     Effort: Pulmonary effort is normal.     Breath sounds: Normal breath sounds.  Abdominal:     Palpations: Abdomen is soft.     Tenderness: There is no abdominal tenderness.  Musculoskeletal:     Cervical back: Normal range of motion and neck supple.  Skin:    General: Skin is warm and dry.     Comments: There are 2 resolving pustules on the posterior scalp that are not fluctuant or tender.  There is a fluctuant pustule on the top of the head that I am able to express a moderate amount of drainage from.  There is also a pustule in the right axilla that is already open and I was able to express a small amount of additional drainage from.  No significant  cellulitis.  Neurological:     Mental Status: He is alert.     ED Results / Procedures / Treatments   Labs (all labs ordered are listed, but only abnormal results are displayed) Labs Reviewed  CBG MONITORING, ED - Abnormal; Notable for the following components:      Result Value   Glucose-Capillary 503 (*)    All other components within normal limits    EKG None  Radiology No results found.  Procedures Procedures    Medications Ordered in ED Medications  insulin regular (NOVOLIN R) 100 units/mL injection 10 Units (has no administration in time range)  doxycycline (VIBRA-TABS) tablet 100 mg (has no administration in time range)    ED Course/ Medical Decision Making/ A&P    Patient seen and examined. History obtained directly from patient.  I was able to manually express some drainage from both the right axillary and superior scalp pustule without need for I&D procedure.  I believe that this is sufficient at this time.  Will avoid further skin trauma.  Labs/EKG: Ordered cbg.  Imaging: None ordered  Medications/Fluids: None ordered  Most recent vital signs reviewed and are as follows: BP (!) 177/119 (BP Location: Right Arm)   Pulse 71   Temp 98.8 F (37.1 C) (Oral)   Resp 18   Ht 5' 9"$  (1.753 m)   Wt 113.4 kg   SpO2 98%   BMI 36.92 kg/m   Initial impression: Skin pustules, uncontrolled hypertension, medication noncompliance.  5:26 PM Reassessment performed. Patient appears stable.  Labs personally reviewed and interpreted including: CBG over 500.  Most current vital signs reviewed and are as follows: BP (!) 177/119 (BP Location: Right Arm)   Pulse 71   Temp 98.8 F (37.1 C) (Oral)   Resp 18   Ht 5' 9"$  (1.753 m)   Wt 113.4 kg   SpO2 98%   BMI 36.92 kg/m   Plan: Discharge to home.  Patient's family member states that she is getting the patient into the primary care doctor's office to get restarted on his medications.  Will give a dose of  subcutaneous insulin here due to elevated blood sugar.  Clinically, no signs of DKA and I do not feel that further workup is indicated here today.  Prescriptions written for: Lisinopril and metformin.  Will also give doxycycline for skin infection in setting of uncontrolled hyperglycemia.  Other home care instructions discussed: Warm compresses  ED return instructions discussed: Worsening swelling, pain, vomiting or other concerns.  Follow-up instructions discussed: Patient encouraged to follow-up with their PCP in 3  to 5 days.                             Medical Decision Making  Patient with mild skin pustules, open and draining.  Incidentally noted to have hyperglycemia and uncontrolled hypertension.  Patient admits to noncompliance with medications.  Low concern for DKA.  Low concern for sepsis.  Patient treated as above.  He will need close outpatient follow-up with PCP to reinitiate medications.  Strongly encouraged this with patient today.  Will initiate lisinopril and metformin until he can follow-up.  The patient's vital signs, pertinent lab work and imaging were reviewed and interpreted as discussed in the ED course. Hospitalization was considered for further testing, treatments, or serial exams/observation. However as patient is well-appearing, has a stable exam, and reassuring studies today, I do not feel that they warrant admission at this time. This plan was discussed with the patient who verbalizes agreement and comfort with this plan and seems reliable and able to return to the Emergency Department with worsening or changing symptoms.          Final Clinical Impression(s) / ED Diagnoses Final diagnoses:  Skin pustule  Hyperglycemia  Uncontrolled hypertension    Rx / DC Orders ED Discharge Orders          Ordered    lisinopril (ZESTRIL) 10 MG tablet  Daily        03/30/22 1724    metFORMIN (GLUCOPHAGE) 500 MG tablet  2 times daily with meals        03/30/22 1724               Carlisle Cater, PA-C 03/30/22 1730    Regan Lemming, MD 03/30/22 2321

## 2022-03-30 NOTE — Discharge Instructions (Signed)
Please use warm compresses on the areas of the skin that are draining or swollen every few hours to help promote drainage.  Please restart the blood pressure medication and diabetes medications so that when you are seen by primary care, they can determine what else you will need.  It is likely that your uncontrolled blood sugars are making the skin infection worse.

## 2022-03-30 NOTE — ED Triage Notes (Signed)
Pt arrives with c/o an abscess on his head and in his right arm pit. Per pt, he did express some drainage out of the one in his arm pit.

## 2022-03-30 NOTE — ED Notes (Signed)
Patient states they "say he's a diabetic" but has not been to see a PCP about his A1c.

## 2022-09-15 ENCOUNTER — Encounter (HOSPITAL_BASED_OUTPATIENT_CLINIC_OR_DEPARTMENT_OTHER): Payer: Self-pay | Admitting: Radiology

## 2022-09-15 ENCOUNTER — Emergency Department (HOSPITAL_BASED_OUTPATIENT_CLINIC_OR_DEPARTMENT_OTHER)
Admission: EM | Admit: 2022-09-15 | Discharge: 2022-09-15 | Disposition: A | Discharge status: Home / Self Care | Payer: BLUE CROSS/BLUE SHIELD | Attending: Emergency Medicine | Admitting: Emergency Medicine

## 2022-09-15 ENCOUNTER — Emergency Department (HOSPITAL_BASED_OUTPATIENT_CLINIC_OR_DEPARTMENT_OTHER): Payer: BLUE CROSS/BLUE SHIELD

## 2022-09-15 ENCOUNTER — Other Ambulatory Visit: Payer: Self-pay

## 2022-09-15 DIAGNOSIS — M545 Low back pain, unspecified: Secondary | ICD-10-CM | POA: Diagnosis not present

## 2022-09-15 DIAGNOSIS — R1031 Right lower quadrant pain: Secondary | ICD-10-CM | POA: Diagnosis not present

## 2022-09-15 DIAGNOSIS — Z79899 Other long term (current) drug therapy: Secondary | ICD-10-CM | POA: Insufficient documentation

## 2022-09-15 DIAGNOSIS — E1165 Type 2 diabetes mellitus with hyperglycemia: Secondary | ICD-10-CM | POA: Diagnosis not present

## 2022-09-15 DIAGNOSIS — Z794 Long term (current) use of insulin: Secondary | ICD-10-CM | POA: Diagnosis not present

## 2022-09-15 DIAGNOSIS — I10 Essential (primary) hypertension: Secondary | ICD-10-CM | POA: Diagnosis not present

## 2022-09-15 DIAGNOSIS — D75839 Thrombocytosis, unspecified: Secondary | ICD-10-CM | POA: Insufficient documentation

## 2022-09-15 DIAGNOSIS — R109 Unspecified abdominal pain: Secondary | ICD-10-CM

## 2022-09-15 LAB — CBC
HCT: 43.2 % (ref 39.0–52.0)
Hemoglobin: 15.2 g/dL (ref 13.0–17.0)
MCH: 32.1 pg (ref 26.0–34.0)
MCHC: 35.2 g/dL (ref 30.0–36.0)
MCV: 91.1 fL (ref 80.0–100.0)
Platelets: 412 10*3/uL — ABNORMAL HIGH (ref 150–400)
RBC: 4.74 MIL/uL (ref 4.22–5.81)
RDW: 12.2 % (ref 11.5–15.5)
WBC: 5.6 10*3/uL (ref 4.0–10.5)
nRBC: 0 % (ref 0.0–0.2)

## 2022-09-15 LAB — URINALYSIS, ROUTINE W REFLEX MICROSCOPIC
Bilirubin Urine: NEGATIVE
Glucose, UA: NEGATIVE mg/dL
Hgb urine dipstick: NEGATIVE
Ketones, ur: NEGATIVE mg/dL
Leukocytes,Ua: NEGATIVE
Nitrite: NEGATIVE
Protein, ur: NEGATIVE mg/dL
Specific Gravity, Urine: >=1.03 (ref 1.005–1.030)
pH: 5.5 (ref 5.0–8.0)

## 2022-09-15 LAB — BASIC METABOLIC PANEL
Anion gap: 9 (ref 5–15)
BUN: 14 mg/dL (ref 6–20)
CO2: 26 mmol/L (ref 22–32)
Calcium: 8.9 mg/dL (ref 8.9–10.3)
Chloride: 103 mmol/L (ref 98–111)
Creatinine, Ser: 0.9 mg/dL (ref 0.61–1.24)
GFR, Estimated: >60 mL/min (ref >60)
Glucose, Bld: 177 mg/dL — ABNORMAL HIGH (ref 70–99)
Potassium: 4.2 mmol/L (ref 3.5–5.1)
Sodium: 138 mmol/L (ref 135–145)

## 2022-09-15 MED ORDER — IOHEXOL 300 MG/ML  SOLN
125.0000 mL | Freq: Once | INTRAMUSCULAR | Status: AC | PRN
  Administered 2022-09-15: 125 mL via INTRAVENOUS

## 2022-09-15 MED ORDER — LIDOCAINE 5 % EX PTCH
1.0000 | MEDICATED_PATCH | CUTANEOUS | Status: DC
  Administered 2022-09-15: 1 via TRANSDERMAL
  Filled 2022-09-15: qty 1

## 2022-09-15 MED ORDER — TIZANIDINE HCL 4 MG PO TABS: 4.0000 mg | ORAL_TABLET | Freq: Three times a day (TID) | ORAL | 0 refills | Status: DC | PRN

## 2022-09-15 MED ORDER — IOHEXOL 350 MG/ML SOLN: 125.0000 mL | Freq: Once | INTRAVENOUS | Status: DC | PRN

## 2022-09-15 MED ORDER — KETOROLAC TROMETHAMINE 15 MG/ML IJ SOLN
15.0000 mg | Freq: Once | INTRAMUSCULAR | Status: AC
  Administered 2022-09-15: 15 mg via INTRAVENOUS
  Filled 2022-09-15: qty 1

## 2022-09-15 MED ORDER — TIZANIDINE HCL 4 MG PO TABS: 4.0000 mg | ORAL_TABLET | Freq: Three times a day (TID) | ORAL | 0 refills | Status: AC | PRN

## 2022-09-15 MED ORDER — IBUPROFEN 600 MG PO TABS: 600.0000 mg | ORAL_TABLET | Freq: Four times a day (QID) | ORAL | 0 refills | Status: AC | PRN

## 2022-09-15 MED ORDER — IBUPROFEN 600 MG PO TABS: 600.0000 mg | ORAL_TABLET | Freq: Four times a day (QID) | ORAL | 0 refills | Status: DC | PRN

## 2022-09-15 NOTE — ED Notes (Addendum)
Pt in CT. Will return for med admin when he returns to room; significant other made aware

## 2022-09-15 NOTE — Discharge Instructions (Addendum)
You were seen in the ER today for evaluation of your abdominal pain and constipation.  I would think you are having some musculoskeletal back pain likely from the straining from your constipation.  Your CT scan does show you have some gas in your belly, you can take some over-the-counter simethicone or Gas-X for this.  For your muscular pain, I do recommend topical lidocaine patches.  I recommend ibuprofen 600 mg every 6 hours as needed for pain.  You can also add on 1000mg  of Tylenol to this as well.  I am sending you home with some muscle relaxers.  Please take as needed.  Please do not drive or operate any heavy machinery while on this medication as it can make you sleepy.  For constipation prevention, you can add a scoop of MiraLAX into coffee, Gatorade, water, etc. every morning.  I would like for you to follow with your primary care doctor as needed.  If you have any concerns with new or worsening symptoms, please return to your nearest emergency department for evaluation.   Contact a doctor if: Medicine does not help your pain. You have new symptoms. Your pain gets worse. Your symptoms last longer than 2-3 days. You have trouble peeing. You are peeing more often than normal. Get help right away if: You have trouble breathing. You are short of breath. Your belly hurts, or it is swollen or red. You feel like you may vomit (nauseous). You vomit. You feel faint, or you faint. You have blood in your pee. You have flank pain and a fever. These symptoms may be an emergency. Get help right away. Call your local emergency services (911 in the [place] Do not wait to see if the symptoms will go away. Do not drive yourself to the hospital. Do not drive yourself to the hospital.

## 2022-09-15 NOTE — ED Provider Notes (Signed)
EMERGENCY DEPARTMENT AT [facility] Provider Note   CSN: [id number] Arrival date & time: 09/15/22  1722     History  Chief Complaint  Patient presents with   Flank Pain    Frank Duncan is a 42 y.o. male with history of diabetes and hypertension presents emerged from today for evaluation of right lower back pain radiating into the right lower abdomen..  Patient reports that it was worse on Sunday but has been gradually improving.  He reports has been having a history of chronic constipation for the past 2 weeks.  He took mag citrate yesterday and had 4 bowel movements and a bowel movement this morning as well and he feels better but was still having the back pain right lower.  He denies any dysuria or any melena or hematochezia.  Denies any nausea or vomiting.  Stable to pass flatulence.  He denies any heavy lifting or any trauma to the area.  He denies any allergies to medications.  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   Flank Pain Associated symptoms include abdominal pain. Pertinent negatives include no chest pain and no shortness of breath.       Home Medications Prior to Admission medications   Medication Sig Start Date End Date Taking? Authorizing Provider  azithromycin (ZITHROMAX) 250 MG tablet Take 1 tablet (250 mg total) by mouth daily. Take first 2 tablets together, then 1 every day until finished. 09/08/20   Vanetta Mulders, MD  docusate sodium (COLACE) 100 MG capsule Take 1 capsule (100 mg total) by mouth every 12 (twelve) hours. Patient not taking: Reported on 11/13/2015 12/10/14   Horton, Mayer Masker, MD  doxycycline (VIBRAMYCIN) 100 MG capsule Take 1 capsule (100 mg total) by mouth 2 (two) times daily. 03/30/22   Renne Crigler, PA-C  hydrochlorothiazide (HYDRODIURIL) 12.5 MG tablet Take 2 tablets (25 mg total) by mouth daily. 03/26/16   Lizbeth Bark, FNP  ibuprofen (ADVIL) 600 MG tablet Take 1 tablet (600 mg total) by mouth every 6  (six) hours as needed. 09/15/22   Achille Rich, PA-C  insulin aspart (NOVOLOG) 100 UNIT/ML FlexPen Inject 6 Units into the skin 3 (three) times daily with meals. 03/26/16   Lizbeth Bark, FNP  Insulin Glargine (LANTUS) 100 UNIT/ML Solostar Pen Inject 55 Units into the skin at bedtime. 03/26/16   Lizbeth Bark, FNP  lisinopril (ZESTRIL) 10 MG tablet Take 1 tablet (10 mg total) by mouth daily. 03/30/22   Renne Crigler, PA-C  metFORMIN (GLUCOPHAGE) 500 MG tablet Take 1 tablet (500 mg total) by mouth 2 (two) times daily with a meal. 03/30/22   Renne Crigler, PA-C  neomycin-polymyxin-hydrocortisone (CORTISPORIN) 3.5-10000-1 OTIC suspension Place 4 drops into the left ear 3 (three) times daily. 09/08/20   Vanetta Mulders, MD  nicotine (NICODERM CQ - DOSED IN MG/24 HOURS) 21 mg/24hr patch Place 1 patch (21 mg total) onto the skin daily. 03/26/16   Lizbeth Bark, FNP  polyethylene glycol powder (MIRALAX) powder Take one capful by mouth 2 times a day until stools are loose Patient not taking: Reported on 11/13/2015 12/10/14   Horton, Mayer Masker, MD  tiZANidine (ZANAFLEX) 4 MG tablet Take 1 tablet (4 mg total) by mouth every 8 (eight) hours as needed for muscle spasms. 09/15/22   Achille Rich, PA-C      Allergies    Patient has no known allergies.    Review of Systems   Review of Systems  Constitutional:  Negative for  chills and fever.  Respiratory:  Negative for cough and shortness of breath.   Cardiovascular:  Negative for chest pain.  Gastrointestinal:  Positive for abdominal pain and constipation. Negative for abdominal distention, anal bleeding, blood in stool, diarrhea, nausea and vomiting.       Denies any fecal incontinence  Genitourinary:  Negative for difficulty urinating, dysuria and hematuria.       Denies any urinary incontinence or urinary retention.  Musculoskeletal:  Positive for back pain.       Denies any saddle anesthesia  Neurological:  Negative for weakness and  numbness.    Physical Exam Updated Vital Signs BP (!) 144/78   Pulse 66   Temp 98.4 F (36.9 C) (Oral)   Resp 14   Ht 5\' 9"  (1.753 m)   Wt 106.6 kg   SpO2 99%   BMI 34.70 kg/m  Physical Exam Vitals and nursing note reviewed.  Constitutional:      General: He is not in acute distress.    Appearance: He is not ill-appearing or toxic-appearing.  HENT:     Mouth/Throat:     Mouth: Mucous membranes are moist.  Eyes:     General: No scleral icterus. Cardiovascular:     Rate and Rhythm: Normal rate.  Pulmonary:     Effort: Pulmonary effort is normal. No respiratory distress.  Abdominal:     General: Bowel sounds are normal.     Palpations: Abdomen is soft.     Tenderness: There is abdominal tenderness. There is no right CVA tenderness, left CVA tenderness, guarding or rebound.     Comments: Some tenderness to the right lower quadrant.  Appears mild.  Soft.  No guarding rebound noted.  Normal active bowel sounds.  No overlying skin changes noted.  Musculoskeletal:        General: Tenderness present.     Cervical back: Normal range of motion.       Back:     Right lower leg: No edema.     Left lower leg: No edema.     Comments: No cervical, thoracic, or lumbar midline tenderness palpation.  There is some right sided paraspinal lower lumbar tenderness palpation.  No overlying skin changes noted.  No increased warmth, erythema, fluctuance, or induration noted.  Strength is symmetric in upper and lower extremities.  Ambulatory.  Skin:    General: Skin is warm and dry.  Neurological:     Mental Status: He is alert.     Gait: Gait normal.     ED Results / Procedures / Treatments   Labs (all labs ordered are listed, but only abnormal results are displayed) Labs Reviewed  BASIC METABOLIC PANEL - Abnormal; Notable for the following components:      Result Value   Glucose, Bld 177 (*)    All other components within normal limits  CBC - Abnormal; Notable for the following  components:   Platelets 412 (*)    All other components within normal limits  URINALYSIS, ROUTINE W REFLEX MICROSCOPIC    EKG None  Radiology CT ABDOMEN PELVIS W CONTRAST  Result Date: 09/15/2022 CLINICAL DATA:  Back and flank pain EXAM: CT ABDOMEN AND PELVIS WITH CONTRAST TECHNIQUE: Multidetector CT imaging of the abdomen and pelvis was performed using the standard protocol following bolus administration of intravenous contrast. RADIATION DOSE REDUCTION: This exam was performed according to the departmental dose-optimization program which includes automated exposure control, adjustment of the mA and/or kV according to patient size and/or  use of iterative reconstruction technique. CONTRAST:  OMNIPAQUE IOHEXOL 300 MG/ML  SOLN COMPARISON:  None Available. FINDINGS: Lower chest: No acute abnormality. No pleural or pericardial effusion. Hepatobiliary: No focal liver abnormality is seen. No gallstones, gallbladder wall thickening, or biliary dilatation. Pancreas: Unremarkable. No pancreatic ductal dilatation or surrounding inflammatory changes. Spleen: Normal in size without focal abnormality. Adrenals/Urinary Tract: Adrenal glands are unremarkable. Kidneys are normal, without renal calculi, focal lesion, or hydronephrosis. Bladder is unremarkable. Stomach/Bowel: Stomach is within normal limits. Appendix appears normal. No evidence of bowel wall thickening, distention, or inflammatory changes. There is diffuse colonic diverticulosis. Vascular/Lymphatic: No significant vascular findings are present. No enlarged abdominal or pelvic lymph nodes. Reproductive: Prostate is unremarkable. Other: No abdominal wall hernia or abnormality. No abdominopelvic ascites. Musculoskeletal: No acute or significant osseous findings. IMPRESSION: Diverticulosis.  No acute abdominal or pelvic pathology. Electronically Signed   By: Layla Maw M.D.   On: 09/15/2022 21:36    Procedures Procedures   Medications Ordered  in ED Medications  iohexol (OMNIPAQUE) 300 MG/ML solution 125 mL (125 mLs Intravenous Contrast Given 09/15/22 2105)  ketorolac (TORADOL) 15 MG/ML injection 15 mg (15 mg Intravenous Given 09/15/22 2237)    ED Course/ Medical Decision Making/ A&P                               Medical Decision Making Amount and/or Complexity of Data Reviewed Labs: ordered. Radiology: ordered.  Risk Prescription drug management.   42 y.o. male presents to the ER for evaluation of right lower back and abdominal pain. Differential diagnosis includes but is not limited to AAA, mesenteric ischemia, appendicitis, diverticulitis, DKA, gastroenteritis, nephrolithiasis, pancreatitis, constipation, UTI, bowel obstruction, biliary disease, IBD, PUD, hepatitis. Vital signs show mildly elevated BP, otherwise unremarkable. Physical exam as noted above.   I independently reviewed and interpreted the patient's labs.  CBC shows mildly increased platelets but otherwise no leukocytosis or anemia.  I do not any recent lab work compare this to.  BMP shows mildly elevated glucose at 177 although patient known type II diabetic.  No other electrolyte abnormality.  Urinalysis unremarkable.  CT abdomen pelvis shows Diverticulosis.  No acute abdominal or pelvic pathology per radiology read.   I suspect the patient is having some right-sided lower back pain going into the abdomen either from gas pain from the mag citrate or reflective or constipation or from straining or sitting on the toilet from prolonged amounts.  He is not febrile nor does he use any IV drugs.  I have a lower suspicion for any epidural abscess or any cauda equina as he does not have any of his red flag symptoms or midline tenderness/pain.  He does not have any tenderness or pain into the right upper quadrant.  Is all in the right lower quadrant.  I have a lower suspicion for any gallbladder etiology.  He overall has been feeling better.  I encouraged him to continue using  MiraLAX daily to help him with his chronic constipation.  Recommended him staying well-hydrated drinking plenty of fluids, mainly water.  He reports pain improvement with the Toradol and the lidocaine.  Will send him home with some tizanidine and some ibuprofen.  Given his reassuring workup with his CT and labs, I do believe the patient stable for discharge home.  We discussed the results of the labs/imaging. The plan is take medication as prescribed, follow-up as needed. We discussed strict return precautions  and red flag symptoms. The patient verbalized their understanding and agrees to the plan. The patient is stable and being discharged home in good condition.  Portions of this report may have been transcribed using voice recognition software. Every effort was made to ensure accuracy; however, inadvertent computerized transcription errors may be present.   Final Clinical Impression(s) / ED Diagnoses Final diagnoses:  Right flank pain    Rx / DC Orders ED Discharge Orders          Ordered    tiZANidine (ZANAFLEX) 4 MG tablet  Every 8 hours PRN,   Status:  Discontinued        09/15/22 2225    ibuprofen (ADVIL) 600 MG tablet  Every 6 hours PRN,   Status:  Discontinued        09/15/22 2225    ibuprofen (ADVIL) 600 MG tablet  Every 6 hours PRN        09/15/22 2230    tiZANidine (ZANAFLEX) 4 MG tablet  Every 8 hours PRN        09/15/22 2230              Achille Rich, PA-C 09/17/22 1148    Edwin Dada P, DO 09/22/22 [phone]

## 2022-09-15 NOTE — ED Triage Notes (Signed)
Pt states he has right back and flank pain that started on Sunday and came on suddenly. Pt denies exacerbating or alleviating factors. PT states the pain is constant. Pt denies urinary symptoms or history of kidney stone.
